# Patient Record
Sex: Male | Born: 1950 | Race: White | Hispanic: No | Marital: Married | Smoking: Former smoker
Health system: Southern US, Community
[De-identification: ages and names within clinical notes are randomized; demographics above are authoritative.]

## PROBLEM LIST (undated history)

## (undated) DIAGNOSIS — I10 Essential (primary) hypertension: Secondary | ICD-10-CM

## (undated) DIAGNOSIS — C4491 Basal cell carcinoma of skin, unspecified: Secondary | ICD-10-CM

## (undated) DIAGNOSIS — E785 Hyperlipidemia, unspecified: Secondary | ICD-10-CM

## (undated) DIAGNOSIS — Z21 Asymptomatic human immunodeficiency virus [HIV] infection status: Secondary | ICD-10-CM

## (undated) DIAGNOSIS — B20 Human immunodeficiency virus [HIV] disease: Secondary | ICD-10-CM

## (undated) DIAGNOSIS — D649 Anemia, unspecified: Secondary | ICD-10-CM

## (undated) HISTORY — DX: Anemia, unspecified: D64.9

## (undated) HISTORY — PX: OTHER SURGICAL HISTORY: SHX169

## (undated) HISTORY — DX: Basal cell carcinoma of skin, unspecified: C44.91

## (undated) HISTORY — DX: Essential (primary) hypertension: I10

## (undated) HISTORY — DX: Hyperlipidemia, unspecified: E78.5

## (undated) HISTORY — PX: COLONOSCOPY: SHX174

---

## 2001-10-29 ENCOUNTER — Encounter: Payer: Self-pay | Admitting: Internal Medicine

## 2003-04-02 ENCOUNTER — Encounter: Payer: Self-pay | Admitting: Internal Medicine

## 2004-12-13 ENCOUNTER — Encounter: Admission: RE | Admit: 2004-12-13 | Discharge: 2004-12-13 | Payer: Self-pay | Admitting: Internal Medicine

## 2004-12-13 ENCOUNTER — Ambulatory Visit: Payer: Self-pay | Admitting: Internal Medicine

## 2004-12-15 ENCOUNTER — Encounter: Admission: RE | Admit: 2004-12-15 | Discharge: 2004-12-15 | Payer: Self-pay | Admitting: Internal Medicine

## 2005-03-24 ENCOUNTER — Ambulatory Visit: Payer: Self-pay | Admitting: Internal Medicine

## 2005-04-11 ENCOUNTER — Ambulatory Visit: Payer: Self-pay | Admitting: Internal Medicine

## 2005-10-03 ENCOUNTER — Ambulatory Visit: Payer: Self-pay | Admitting: Internal Medicine

## 2005-10-10 ENCOUNTER — Ambulatory Visit: Payer: Self-pay | Admitting: Internal Medicine

## 2006-03-26 ENCOUNTER — Ambulatory Visit: Payer: Self-pay | Admitting: Family Medicine

## 2006-03-26 ENCOUNTER — Encounter: Payer: Self-pay | Admitting: Internal Medicine

## 2006-05-15 ENCOUNTER — Ambulatory Visit: Payer: Self-pay | Admitting: Internal Medicine

## 2006-05-15 LAB — CONVERTED CEMR LAB
ALT: 19 units/L (ref 0–40)
AST: 23 units/L (ref 0–37)
Albumin: 4 g/dL (ref 3.5–5.2)
Alkaline Phosphatase: 46 units/L (ref 39–117)
BUN: 15 mg/dL (ref 6–23)
Basophils Relative: 0.3 % (ref 0.0–1.0)
Chol/HDL Ratio, serum: 5.2
Eosinophil percent: 1.4 % (ref 0.0–5.0)
GFR calc non Af Amer: 94 mL/min
Glomerular Filtration Rate, Af Am: 114 mL/min/{1.73_m2}
HDL: 38.2 mg/dL — ABNORMAL LOW (ref >39.0)
LDL Cholesterol: 151 mg/dL — ABNORMAL HIGH (ref 0–99)
MCHC: 33.6 g/dL (ref 30.0–36.0)
MCV: 96.7 fL (ref 78.0–100.0)
Monocytes Absolute: 0.5 10*3/uL (ref 0.2–0.7)
Monocytes Relative: 5.9 % (ref 3.0–11.0)
Neutro Abs: 6.1 10*3/uL (ref 1.4–7.7)
Neutrophils Relative %: 71.5 % (ref 43.0–77.0)
Platelets: 255 10*3/uL (ref 150–400)
RDW: 12.5 % (ref 11.5–14.6)
Sodium: 141 meq/L (ref 135–145)
TSH: 0.54 microintl units/mL (ref 0.35–5.50)
Triglyceride fasting, serum: 52 mg/dL (ref 0–149)
WBC: 8.5 10*3/uL (ref 4.5–10.5)

## 2006-05-25 ENCOUNTER — Ambulatory Visit: Payer: Self-pay | Admitting: Internal Medicine

## 2006-07-06 ENCOUNTER — Ambulatory Visit: Payer: Self-pay | Admitting: Internal Medicine

## 2006-08-06 ENCOUNTER — Ambulatory Visit: Payer: Self-pay | Admitting: Internal Medicine

## 2006-08-06 LAB — CONVERTED CEMR LAB
HDL: 39.7 mg/dL (ref >39.0)
LDL Cholesterol: 141 mg/dL — ABNORMAL HIGH (ref 0–99)

## 2006-08-13 ENCOUNTER — Ambulatory Visit: Payer: Self-pay | Admitting: Internal Medicine

## 2007-03-26 DIAGNOSIS — M810 Age-related osteoporosis without current pathological fracture: Secondary | ICD-10-CM | POA: Insufficient documentation

## 2008-04-22 ENCOUNTER — Ambulatory Visit: Payer: Self-pay | Admitting: Internal Medicine

## 2008-04-22 ENCOUNTER — Encounter: Payer: Self-pay | Admitting: Internal Medicine

## 2008-05-04 ENCOUNTER — Ambulatory Visit: Payer: Self-pay | Admitting: Internal Medicine

## 2008-05-04 LAB — CONVERTED CEMR LAB
BUN: 13 mg/dL (ref 6–23)
Blood in Urine, dipstick: NEGATIVE
CO2: 30 meq/L (ref 19–32)
Chloride: 102 meq/L (ref 96–112)
Creatinine, Ser: 0.8 mg/dL (ref 0.4–1.5)
Eosinophils Relative: 2.5 % (ref 0.0–5.0)
GFR calc Af Amer: 129 mL/min
GFR calc non Af Amer: 107 mL/min
Glucose, Bld: 100 mg/dL — ABNORMAL HIGH (ref 70–99)
Glucose, Urine, Semiquant: NEGATIVE
HCT: 39.8 % (ref 39.0–52.0)
HDL: 44 mg/dL (ref >39.0)
Ketones, urine, test strip: NEGATIVE
MCHC: 34.5 g/dL (ref 30.0–36.0)
MCV: 98.2 fL (ref 78.0–100.0)
Monocytes Absolute: 0.5 10*3/uL (ref 0.1–1.0)
Neutro Abs: 5.4 10*3/uL (ref 1.4–7.7)
Neutrophils Relative %: 67.9 % (ref 43.0–77.0)
PSA: 0.83 ng/mL (ref 0.10–4.00)
Potassium: 4.8 meq/L (ref 3.5–5.1)
Protein, U semiquant: NEGATIVE
RDW: 12.7 % (ref 11.5–14.6)
Total Bilirubin: 1.1 mg/dL (ref 0.3–1.2)
WBC: 7.9 10*3/uL (ref 4.5–10.5)

## 2008-05-11 ENCOUNTER — Ambulatory Visit: Payer: Self-pay | Admitting: Internal Medicine

## 2008-10-14 ENCOUNTER — Encounter: Payer: Self-pay | Admitting: Internal Medicine

## 2009-04-19 ENCOUNTER — Encounter: Payer: Self-pay | Admitting: Internal Medicine

## 2009-07-19 ENCOUNTER — Encounter (INDEPENDENT_AMBULATORY_CARE_PROVIDER_SITE_OTHER): Payer: Self-pay | Admitting: *Deleted

## 2009-08-27 ENCOUNTER — Encounter: Payer: Self-pay | Admitting: Internal Medicine

## 2009-09-06 ENCOUNTER — Encounter: Payer: Self-pay | Admitting: Internal Medicine

## 2009-09-08 ENCOUNTER — Encounter: Payer: Self-pay | Admitting: Internal Medicine

## 2009-10-18 ENCOUNTER — Encounter: Payer: Self-pay | Admitting: Internal Medicine

## 2009-10-19 ENCOUNTER — Encounter: Payer: Self-pay | Admitting: Internal Medicine

## 2009-12-20 ENCOUNTER — Ambulatory Visit: Payer: Self-pay | Admitting: Internal Medicine

## 2009-12-20 LAB — CONVERTED CEMR LAB
ALT: 18 units/L (ref 0–53)
AST: 21 units/L (ref 0–37)
Albumin: 4.3 g/dL (ref 3.5–5.2)
BUN: 16 mg/dL (ref 6–23)
Bilirubin, Direct: 0.1 mg/dL (ref 0.0–0.3)
Blood in Urine, dipstick: NEGATIVE
CO2: 32 meq/L (ref 19–32)
Calcium: 8.9 mg/dL (ref 8.4–10.5)
Eosinophils Absolute: 0.2 10*3/uL (ref 0.0–0.7)
GFR calc non Af Amer: 119.36 mL/min (ref >60)
Glucose, Bld: 100 mg/dL — ABNORMAL HIGH (ref 70–99)
Lymphocytes Relative: 32.3 % (ref 12.0–46.0)
MCHC: 34.3 g/dL (ref 30.0–36.0)
MCV: 98.7 fL (ref 78.0–100.0)
Nitrite: NEGATIVE
Protein, U semiquant: NEGATIVE
RDW: 14.1 % (ref 11.5–14.6)
Specific Gravity, Urine: 1.02
Total Bilirubin: 0.7 mg/dL (ref 0.3–1.2)
Total CHOL/HDL Ratio: 4
Total Protein: 7 g/dL (ref 6.0–8.3)
Triglycerides: 51 mg/dL (ref 0.0–149.0)
Urobilinogen, UA: 0.2
VLDL: 10.2 mg/dL (ref 0.0–40.0)
WBC Urine, dipstick: NEGATIVE

## 2010-01-04 ENCOUNTER — Ambulatory Visit: Payer: Self-pay | Admitting: Internal Medicine

## 2010-01-14 ENCOUNTER — Encounter: Payer: Self-pay | Admitting: Internal Medicine

## 2010-01-17 DIAGNOSIS — F17200 Nicotine dependence, unspecified, uncomplicated: Secondary | ICD-10-CM | POA: Insufficient documentation

## 2010-01-24 ENCOUNTER — Encounter: Payer: Self-pay | Admitting: Internal Medicine

## 2010-03-10 ENCOUNTER — Encounter (INDEPENDENT_AMBULATORY_CARE_PROVIDER_SITE_OTHER): Payer: Self-pay | Admitting: *Deleted

## 2010-08-30 NOTE — Letter (Signed)
Summary: [facility]  [facility]   Imported By: Maryln Gottron 10/22/2009 15:30:54  _____________________________________________________________________  External Attachment:    Type:   Image     Comment:   External Document

## 2010-08-30 NOTE — Letter (Signed)
Summary: [facility]  [facility]   Imported By: Maryln Gottron 01/20/2010 13:28:49  _____________________________________________________________________  External Attachment:    Type:   Image     Comment:   External Document

## 2010-08-30 NOTE — Letter (Signed)
Summary: [facility]  [facility]   Imported By: Maryln Gottron 10/20/2009 13:16:05  _____________________________________________________________________  External Attachment:    Type:   Image     Comment:   External Document

## 2010-08-30 NOTE — Letter (Signed)
Summary: [facility]  [facility]   Imported By: Maryln Gottron 09/13/2009 14:45:21  _____________________________________________________________________  External Attachment:    Type:   Image     Comment:   External Document

## 2010-08-30 NOTE — Assessment & Plan Note (Signed)
Summary: cpx/njr/PT RESCD FROM [facility]//CCM PT RSC/NJR   Vital Signs:  Patient profile:   60 year old male Height:      71 inches Weight:      151 pounds BMI:     21.14 Temp:     98.2 degrees F oral Pulse rate:   76 / minute Resp:     12 per minute BP sitting:   116 / 76  (left arm)  Vitals Entered By: Willy Eddy, LPN (January 04, 2594 3:08 PM) CC: cpx   CC:  cpx.  History of Present Illness: The pt was asked about all immunizations, health maint. services that are appropriate to their age and was given guidance on diet exercize  and weight management moniterif n nof osteoposis medications and diagnosis reviewed the bone density results with by mouth urgered pt to stop smoking  Preventive Screening-Counseling & Management  Alcohol-Tobacco     Smoking Status: current  Problems Prior to Update: 1)  Physical Examination  (ICD-V70.0) 2)  Family History Diabetes 1st Degree Relative  (ICD-V18.0) 3)  Osteoporosis  (ICD-733.00)  Current Problems (verified): 1)  Physical Examination  (ICD-V70.0) 2)  Family History Diabetes 1st Degree Relative  (ICD-V18.0) 3)  Osteoporosis  (ICD-733.00)  Medications Prior to Update: 1)  Micardis 40 Mg Tabs (Telmisartan) .... Once Daily 2)  Boniva 150 Mg Tabs (Ibandronate Sodium) .... One By Mouth Daily 3)  Niaspan 500 Mg  Tbcr (Niacin (Antihyperlipidemic)) .Marland Kitchen.. 1 Once Daily  Current Medications (verified): 1)  Micardis 40 Mg Tabs (Telmisartan) .... Once Daily 2)  Niaspan 500 Mg  Tbcr (Niacin (Antihyperlipidemic)) .Marland Kitchen.. 1 Once Daily  Allergies (verified): No Known Drug Allergies  Past History:  Family History: Last updated: 03/26/2007 Family History Diabetes 1st degree relative Family History Hypertension Family History of Prostate CA 1st degree relative <50 Family History of Cardiovascular disorder  Social History: Last updated: 03/26/2007 Occupation: Single Current Smoker Alcohol use-yes Drug use-no  Risk Factors: Smoking  Status: current (01/04/2010)  Past medical, surgical, family and social histories (including risk factors) reviewed, and no changes noted (except as noted below).  Past Medical History: Reviewed history from 03/26/2007 and no changes required. Osteoporosis  Past Surgical History: Reviewed history from 03/26/2007 and no changes required. Colonoscopy  Family History: Reviewed history from 03/26/2007 and no changes required. Family History Diabetes 1st degree relative Family History Hypertension Family History of Prostate CA 1st degree relative <50 Family History of Cardiovascular disorder  Social History: Reviewed history from 03/26/2007 and no changes required. Occupation: Single Current Smoker Alcohol use-yes Drug use-no  Review of Systems  The patient denies anorexia, fever, weight loss, weight gain, vision loss, decreased hearing, hoarseness, chest pain, syncope, dyspnea on exertion, peripheral edema, prolonged cough, headaches, hemoptysis, abdominal pain, melena, hematochezia, severe indigestion/heartburn, hematuria, incontinence, genital sores, muscle weakness, suspicious skin lesions, transient blindness, difficulty walking, depression, unusual weight change, abnormal bleeding, enlarged lymph nodes, angioedema, breast masses, and testicular masses.    Physical Exam  General:  alert and well-hydrated.   Head:  normocephalic and atraumatic.   Eyes:  pupils equal and pupils round.   Ears:  R ear normal and L ear normal.   Nose:  no external deformity and no nasal discharge.   Mouth:  good dentition and pharynx pink and moist.   Neck:  No deformities, masses, or tenderness noted. Chest Wall:  No deformities, masses, tenderness or gynecomastia noted. Breasts:  no gynecomastia and no masses.   Lungs:  normal respiratory  effort and no intercostal retractions.   Heart:  normal rate and regular rhythm.   Abdomen:  soft, non-tender, normal bowel sounds, and no distention.     Genitalia:  circumcised and no urethral discharge.   Prostate:  no gland enlargement and no asymmetry.   Msk:  No deformity or scoliosis noted of thoracic or lumbar spine.   Pulses:  R and L carotid,radial,femoral,dorsalis pedis and posterior tibial pulses are full and equal bilaterally Extremities:  No clubbing, cyanosis, edema, or deformity noted with normal full range of motion of all joints.   Neurologic:  No cranial nerve deficits noted. Station and gait are normal. Plantar reflexes are down-going bilaterally. DTRs are symmetrical throughout. Sensory, motor and coordinative functions appear intact.   Impression & Recommendations:  Problem # 1:  PHYSICAL EXAMINATION (ICD-V70.0)  Colonoscopy: abnormal (04/10/2005) Td Booster: Historical (07/31/2000)   Flu Vax: Historical (04/30/2009)   Chol: 177 (12/20/2009)   HDL: 49.50 (12/20/2009)   LDL: 117 (12/20/2009)   TG: 51.0 (12/20/2009) TSH: 0.50 (12/20/2009)   PSA: 0.97 (12/20/2009) Next Colonoscopy due:: 03/2010 (01/04/2010)  Discussed using sunscreen, use of alcohol, drug use, self testicular exam, routine dental care, routine eye care, routine physical exam, seat belts, multiple vitamins, osteoporosis prevention, adequate calcium intake in diet, and recommendations for immunizations.  Discussed exercise and checking cholesterol.  Discussed gun safety, safe sex, and contraception. Also recommend checking PSA.  Problem # 2:  OSTEOPOROSIS (ICD-733.00)  The following medications were removed from the medication list:    Boniva 150 Mg Tabs (Ibandronate sodium) ..... One by mouth daily  Discussed medication use, applications of heat or ice, and exercises.   Orders: T-Bone Densitometry [id number])  Problem # 3:  SMOKER (ICD-305.1)  Encouraged smoking cessation and discussed different methods for smoking cessation.   Complete Medication List: 1)  Micardis 40 Mg Tabs (Telmisartan) .... Once daily 2)  Niaspan 500 Mg Tbcr (Niacin  (antihyperlipidemic)) .Marland Kitchen.. 1 once daily   Patient Instructions: 1)  Please schedule a follow-up appointment in 1 year.   Preventive Care Screening  Colonoscopy:    Date:  04/10/2005    Next Due:  03/2010    Results:  abnormal     Immunization History:  Influenza Immunization History:    Influenza:  historical (04/30/2009)    Preventive Care Screening  Colonoscopy:    Date:  04/10/2005    Next Due:  03/2010    Results:  abnormal

## 2010-08-30 NOTE — Letter (Signed)
Summary: [facility]  [facility]   Imported By: Maryln Gottron 09/06/2009 13:44:19  _____________________________________________________________________  External Attachment:    Type:   Image     Comment:   External Document

## 2010-08-30 NOTE — Miscellaneous (Signed)
Summary: BONE DENSITY  Clinical Lists Changes  Orders: Added new Test order of T-Lumbar Vertebral Assessment (77082) - Signed 

## 2010-08-30 NOTE — Letter (Signed)
Summary: Colonoscopy Date Change Letter  [facility]  [address] [place], [place] [address]   Phone: [phone]  Fax: [phone]      March 10, 2010 MRN: [id number]   [facility] [address] [place], [place]  [address]   Dear Mr. Throne,   Previously you were recommended to have a repeat colonoscopy around this time. Your chart was recently reviewed by Dr. Lajuana Ripple T. Russella Dar of  Gastroenterology. Follow up colonoscopy is now recommended in September 2014. This revised recommendation is based on current, nationally recognized guidelines for colorectal cancer screening and polyp surveillance. These guidelines are endorsed by the American Cancer Society, [employer] on Colorectal Cancer as well as numerous other major medical organizations.  Please understand that our recommendation assumes that you do not have any new symptoms such as bleeding, a change in bowel habits, anemia, or significant abdominal discomfort. If you do have any concerning GI symptoms or want to discuss the guideline recommendations, please call to arrange an office visit at your earliest convenience. Otherwise we will keep you in our reminder system and contact you 1-2 months prior to the date listed above to schedule your next colonoscopy.  Thank you,  Malcom T. Russella Dar, M.D.  [facility] [phone]

## 2010-08-30 NOTE — Letter (Signed)
Summary: [facility]  [facility]   Imported By: Maryln Gottron 09/10/2009 12:56:47  _____________________________________________________________________  External Attachment:    Type:   Image     Comment:   External Document

## 2011-06-28 ENCOUNTER — Other Ambulatory Visit: Payer: Self-pay | Admitting: Dermatology

## 2011-10-11 ENCOUNTER — Other Ambulatory Visit (INDEPENDENT_AMBULATORY_CARE_PROVIDER_SITE_OTHER): Payer: BC Managed Care – PPO

## 2011-10-11 DIAGNOSIS — Z0000 Encounter for general adult medical examination without abnormal findings: Secondary | ICD-10-CM

## 2011-10-11 LAB — LIPID PANEL
HDL: 54.3 mg/dL (ref >39.00)
LDL Cholesterol: 124 mg/dL — ABNORMAL HIGH (ref 0–99)
Triglycerides: 37 mg/dL (ref 0.0–149.0)
VLDL: 7.4 mg/dL (ref 0.0–40.0)

## 2011-10-11 LAB — BASIC METABOLIC PANEL
CO2: 30 mEq/L (ref 19–32)
Chloride: 104 mEq/L (ref 96–112)
Creatinine, Ser: 0.8 mg/dL (ref 0.4–1.5)
Glucose, Bld: 106 mg/dL — ABNORMAL HIGH (ref 70–99)
Sodium: 141 mEq/L (ref 135–145)

## 2011-10-11 LAB — CBC WITH DIFFERENTIAL/PLATELET
Basophils Absolute: 0 10*3/uL (ref 0.0–0.1)
Eosinophils Relative: 4 % (ref 0.0–5.0)
HCT: 41.6 % (ref 39.0–52.0)
Lymphocytes Relative: 24.3 % (ref 12.0–46.0)
Lymphs Abs: 1.6 10*3/uL (ref 0.7–4.0)
MCHC: 33.4 g/dL (ref 30.0–36.0)
Monocytes Absolute: 0.5 10*3/uL (ref 0.1–1.0)
Monocytes Relative: 8.1 % (ref 3.0–12.0)
Neutro Abs: 4.2 10*3/uL (ref 1.4–7.7)
RDW: 13.7 % (ref 11.5–14.6)

## 2011-10-11 LAB — POCT URINALYSIS DIPSTICK
Bilirubin, UA: NEGATIVE
Glucose, UA: NEGATIVE
Ketones, UA: NEGATIVE
Leukocytes, UA: NEGATIVE
Nitrite, UA: NEGATIVE

## 2011-10-11 LAB — PSA: PSA: 0.59 ng/mL (ref 0.10–4.00)

## 2011-10-11 LAB — HEPATIC FUNCTION PANEL
ALT: 18 U/L (ref 0–53)
AST: 22 U/L (ref 0–37)
Albumin: 4.2 g/dL (ref 3.5–5.2)
Bilirubin, Direct: 0.2 mg/dL (ref 0.0–0.3)
Total Bilirubin: 0.6 mg/dL (ref 0.3–1.2)

## 2011-10-16 ENCOUNTER — Telehealth: Payer: Self-pay | Admitting: *Deleted

## 2011-10-16 MED ORDER — OSELTAMIVIR PHOSPHATE 75 MG PO CAPS: 75.0000 mg | ORAL_CAPSULE | Freq: Two times a day (BID) | ORAL | Status: DC

## 2011-10-16 NOTE — Telephone Encounter (Signed)
Pt has CPX on Wed with Dr. Lovell Sheehan, but thinks he has the flu.  C/O myalgias, headache, fever 102, minimal cough.  Has been taking Tylenol extra strength, but is still uncomfortable.  Asking what Dr. Lovell Sheehan suggests?

## 2011-10-16 NOTE — Telephone Encounter (Signed)
Discussed with pt

## 2011-10-16 NOTE — Telephone Encounter (Signed)
Per dr Esmond Camper- if sx have been less that 24 hours ,may have tamiflu 75 bid for 5 days and if it has been greater than 24 hours will need to use mucinex fast max cold and flu-he may  Need to cancel cpx

## 2011-10-17 ENCOUNTER — Encounter: Payer: Self-pay | Admitting: *Deleted

## 2011-10-17 ENCOUNTER — Encounter: Payer: Self-pay | Admitting: Internal Medicine

## 2011-10-18 ENCOUNTER — Encounter: Payer: Self-pay | Admitting: Internal Medicine

## 2011-10-18 ENCOUNTER — Ambulatory Visit (INDEPENDENT_AMBULATORY_CARE_PROVIDER_SITE_OTHER): Payer: BC Managed Care – PPO | Admitting: Internal Medicine

## 2011-10-18 VITALS — BP 160/90 | HR 80 | Temp 102.9°F | Resp 16 | Ht 70.0 in | Wt 154.0 lb

## 2011-10-18 DIAGNOSIS — J1100 Influenza due to unidentified influenza virus with unspecified type of pneumonia: Secondary | ICD-10-CM

## 2011-10-18 DIAGNOSIS — Z0000 Encounter for general adult medical examination without abnormal findings: Secondary | ICD-10-CM

## 2011-10-18 MED ORDER — PSEUDOEPH-CHLORPHEN-HYDROCOD 60-4-5 MG/5ML PO SOLN: 5.0000 mL | Freq: Three times a day (TID) | ORAL | Status: DC

## 2011-10-18 MED ORDER — MOXIFLOXACIN HCL 400 MG PO TABS: 400.0000 mg | ORAL_TABLET | Freq: Every day | ORAL | Status: AC

## 2011-10-18 MED ORDER — MOXIFLOXACIN HCL 400 MG PO TABS: 400.0000 mg | ORAL_TABLET | Freq: Every day | ORAL | Status: DC

## 2011-10-18 NOTE — Progress Notes (Signed)
  Subjective:    Patient ID: Jeffery Franklin, male    DOB: 1951/01/24, 61 y.o.   MRN: [id number]  HPI This is a 61 year old white male who presents for his routine yearly physical examination.  He is significantly sick today with a fever of 102.9 and flulike symptoms for the past 3 days he began Tamiflu but I believe this intervention was too late he has been using Tylenol for his fevers which apparently only reduced the fever slightly.  He is in no respiratory distress he states he has myalgias headaches or throat all classic symptoms of the flu.  He did not get a vaccination this year   Review of Systems  Constitutional: Negative for fever and fatigue.  HENT: Negative for hearing loss, congestion, neck pain and postnasal drip.   Eyes: Negative for discharge, redness and visual disturbance.  Respiratory: Negative for cough, shortness of breath and wheezing.   Cardiovascular: Negative for leg swelling.  Gastrointestinal: Negative for abdominal pain, constipation and abdominal distention.  Genitourinary: Negative for urgency and frequency.  Musculoskeletal: Negative for joint swelling and arthralgias.  Skin: Negative for color change and rash.  Neurological: Negative for weakness and light-headedness.  Hematological: Negative for adenopathy.  Psychiatric/Behavioral: Negative for behavioral problems.       Objective:   Physical Exam  Nursing note and vitals reviewed. Constitutional: He is oriented to person, place, and time. He appears well-developed and well-nourished.  HENT:  Head: Normocephalic and atraumatic.  Eyes: Conjunctivae are normal. Pupils are equal, round, and reactive to light.  Neck: Normal range of motion. Neck supple.  Cardiovascular: Normal rate and regular rhythm.   Pulmonary/Chest: Effort normal and breath sounds normal.  Abdominal: Soft. Bowel sounds are normal.  Genitourinary: Rectum normal and prostate normal.  Musculoskeletal: Normal range of motion.    Neurological: He is alert and oriented to person, place, and time.  Skin: Skin is warm and dry.          Assessment & Plan:   Patient presents for yearly preventative medicine examination.   all immunizations and health maintenance protocols were reviewed with the patient and they are up to date with these protocols.   screening laboratory values were reviewed with the patient including screening of hyperlipidemia PSA renal function and hepatic function.   There medications past medical history social history problem list and allergies were reviewed in detail.   Goals were established with regard to weight loss exercise diet in compliance with medications   patient's classic symptoms of influenza he would be treated with Advil hydration and rest I believe that the Tamiflu would be of little effect at this stage.  He does have a history of smoking and he has some left lower lung field sounds that could be an early pneumonia complicating his flu. He has begun coughing up colored sputum which previously he had not therefore we will treat him with a seven-day course of Avelox and cough suppressant

## 2011-10-18 NOTE — Patient Instructions (Signed)
The patient is instructed to continue all medications as prescribed. Schedule followup with check out clerk upon leaving the clinic  

## 2011-10-25 ENCOUNTER — Ambulatory Visit: Payer: BC Managed Care – PPO | Admitting: Family Medicine

## 2011-11-01 ENCOUNTER — Ambulatory Visit (INDEPENDENT_AMBULATORY_CARE_PROVIDER_SITE_OTHER): Payer: BC Managed Care – PPO | Admitting: Internal Medicine

## 2011-11-01 DIAGNOSIS — Z23 Encounter for immunization: Secondary | ICD-10-CM

## 2011-11-01 DIAGNOSIS — Z0000 Encounter for general adult medical examination without abnormal findings: Secondary | ICD-10-CM

## 2011-11-10 ENCOUNTER — Ambulatory Visit (INDEPENDENT_AMBULATORY_CARE_PROVIDER_SITE_OTHER): Payer: BC Managed Care – PPO | Admitting: Family

## 2011-11-10 ENCOUNTER — Encounter: Payer: Self-pay | Admitting: Family

## 2011-11-10 ENCOUNTER — Telehealth: Payer: Self-pay | Admitting: *Deleted

## 2011-11-10 VITALS — BP 120/80 | Temp 99.2°F | Wt 150.0 lb

## 2011-11-10 DIAGNOSIS — R05 Cough: Secondary | ICD-10-CM

## 2011-11-10 DIAGNOSIS — J329 Chronic sinusitis, unspecified: Secondary | ICD-10-CM

## 2011-11-10 MED ORDER — METHYLPREDNISOLONE ACETATE 80 MG/ML IJ SUSP
80.0000 mg | Freq: Once | INTRAMUSCULAR | Status: AC
  Administered 2011-11-10: 80 mg via INTRAMUSCULAR

## 2011-11-10 MED ORDER — AZITHROMYCIN 250 MG PO TABS: ORAL_TABLET | ORAL | Status: AC

## 2011-11-10 MED ORDER — METHYLPREDNISOLONE ACETATE 80 MG/ML IJ SUSP: 80.0000 mg | Freq: Once | INTRAMUSCULAR | Status: DC

## 2011-11-10 NOTE — Telephone Encounter (Signed)
Pt called and said that he will come in for ov instead. Pt has been schd to see Adline Mango today at 2:30pm.

## 2011-11-10 NOTE — Telephone Encounter (Signed)
Pt is still having swollen and painful lymph nodes behind left ear with headaches lingering from the flu.  He still has very little appetite, and no energy.  Asking if he needs an appt?

## 2011-11-10 NOTE — Patient Instructions (Signed)

## 2011-11-10 NOTE — Progress Notes (Signed)
  Subjective:    Patient ID: Jeffery Franklin, male    DOB: Oct 23, 1950, 61 y.o.   MRN: [id number]  HPI Comments: 71 you white male c/o productive cough with brown tinged sputum, loss of appetite, and sinus drainage x two weeks. "I had the flu a couple weeks ago and was treated with tamiflu, a week after the flu these s/s started." Smokes 5-10 cigarettes per day and is currently seeing a hypnotist to assist with smoking cessation. Denies fever, chills, nausea, vomiting, or shortness of breath.     Review of Systems  Constitutional: Positive for appetite change. Negative for fever, chills, diaphoresis, fatigue and unexpected weight change.  HENT: Positive for congestion, postnasal drip and sinus pressure. Negative for hearing loss, ear pain, nosebleeds, sore throat, facial swelling, rhinorrhea, sneezing, drooling, mouth sores, trouble swallowing, neck pain, neck stiffness, dental problem, voice change, tinnitus and ear discharge.   Respiratory: Negative.   Cardiovascular: Negative.   Skin: Negative.    Past Medical History  Diagnosis Date  . Osteoporosis   . Hypertension   . Hyperlipidemia     History   Social History  . Marital Status: Single    Spouse Name: N/A    Number of Children: N/A  . Years of Education: N/A   Occupational History  . Not on file.   Social History Main Topics  . Smoking status: Current Everyday Smoker  . Smokeless tobacco: Not on file  . Alcohol Use: Yes  . Drug Use: No  . Sexually Active: Not on file   Other Topics Concern  . Not on file   Social History Narrative  . No narrative on file    Past Surgical History  Procedure Date  . Colonoscopy     Family History  Problem Relation Age of Onset  . Diabetes    . Hypertension    . Prostate cancer    . Heart disease      Not on File  Current Outpatient Prescriptions on File Prior to Visit  Medication Sig Dispense Refill  . niacin (NIASPAN) 500 MG CR tablet Take 500 mg by mouth at bedtime.       . Pseudoeph-Chlorphen-Hydrocod 60-4-5 MG/5ML SOLN Take 5 mLs by mouth 3 (three) times daily.      Marland Kitchen telmisartan (MICARDIS) 40 MG tablet Take 40 mg by mouth daily.        BP 120/80  Temp(Src) 99.2 F (37.3 C) (Oral)  Wt 150 lb (68.04 kg)chart    Objective:   Physical Exam  Constitutional: He is oriented to person, place, and time. He appears well-developed and well-nourished. No distress.  HENT:  Head: Normocephalic.  Right Ear: External ear normal.  Left Ear: External ear normal.  Mouth/Throat: Oropharyngeal exudate present.  Eyes: Right eye exhibits no discharge. Left eye exhibits no discharge.  Cardiovascular: Normal rate, regular rhythm, normal heart sounds and intact distal pulses.  Exam reveals no gallop and no friction rub.   No murmur heard. Pulmonary/Chest: Effort normal. No respiratory distress. He has no wheezes. He has no rales. He exhibits no tenderness.  Neurological: He is alert and oriented to person, place, and time.  Skin: Skin is warm and dry. He is not diaphoretic.          Assessment & Plan:  Assessment: Sinusitis Plan: Depo-medrol im, z-pack, increase po fluids and rest, teaching handouts of diagnosis and treatment provided. Opportunity for questions provided. Encouraged to RTC if s/s get worse.

## 2011-11-28 ENCOUNTER — Other Ambulatory Visit: Payer: Self-pay | Admitting: *Deleted

## 2011-11-28 MED ORDER — TELMISARTAN 40 MG PO TABS: 40.0000 mg | ORAL_TABLET | Freq: Every day | ORAL | Status: DC

## 2011-11-28 NOTE — Telephone Encounter (Signed)
Done

## 2012-01-09 ENCOUNTER — Ambulatory Visit (INDEPENDENT_AMBULATORY_CARE_PROVIDER_SITE_OTHER): Payer: BC Managed Care – PPO | Admitting: Family

## 2012-01-09 ENCOUNTER — Encounter: Payer: Self-pay | Admitting: Family

## 2012-01-09 ENCOUNTER — Ambulatory Visit (INDEPENDENT_AMBULATORY_CARE_PROVIDER_SITE_OTHER)
Admission: RE | Admit: 2012-01-09 | Discharge: 2012-01-09 | Disposition: A | Discharge status: Home / Self Care | Payer: BC Managed Care – PPO | Source: Ambulatory Visit | Attending: Family | Admitting: Family

## 2012-01-09 VITALS — BP 160/88 | Temp 98.6°F | Wt 148.0 lb

## 2012-01-09 DIAGNOSIS — M7989 Other specified soft tissue disorders: Secondary | ICD-10-CM

## 2012-01-09 DIAGNOSIS — S6980XA Other specified injuries of unspecified wrist, hand and finger(s), initial encounter: Secondary | ICD-10-CM

## 2012-01-09 DIAGNOSIS — S6990XA Unspecified injury of unspecified wrist, hand and finger(s), initial encounter: Secondary | ICD-10-CM

## 2012-01-09 MED ORDER — IBUPROFEN 600 MG PO TABS: 600.0000 mg | ORAL_TABLET | Freq: Three times a day (TID) | ORAL | Status: AC | PRN

## 2012-01-09 NOTE — Patient Instructions (Signed)
Finger Fracture Fractures of fingers are breaks in the bones of the fingers. There are many types of fractures. There are different ways of treating these fractures, all of which can be correct. Your caregiver will discuss the best way to treat your fracture. TREATMENT  Finger fractures can be treated with:   Non-reduction - this means the bones are in place. The finger is splinted without changing the positions of the bone pieces. The splint is usually left on for about a week to ten days. This will depend on your fracture and what your caregiver thinks.   Closed reduction - the bones are put back into position without using surgery. The finger is then splinted.   ORIF (open reduction and internal fixation) - the fracture site is opened. Then the bone pieces are fixed into place with pins or some type of hardware. This is seldom required. It depends on the severity of the fracture.  Your caregiver will discuss the type of fracture you have and the treatment that will be best for that problem. If surgery is the treatment of choice, the following is information for you to know and also let your caregiver know about prior to surgery. LET YOUR CAREGIVER KNOW ABOUT:  Allergies   Medications taken including herbs, eye drops, over the counter medications, and creams   Use of steroids (by mouth or creams)   Previous problems with anesthetics or Novocaine   Possibility of pregnancy, if this applies   History of blood clots (thrombophlebitis)   History of bleeding or blood problems   Previous surgery   Other health problems  AFTER THE PROCEDURE After surgery, you will be taken to the recovery area where a nurse will check your progress. Once you're awake, stable, and taking fluids well, barring other problems you will be allowed to go home. Once home an ice pack applied to your operative site may help with discomfort and keep the swelling down. HOME CARE INSTRUCTIONS   Follow your  caregiver's instructions as to activities, exercises, physical therapy, and driving a car.   Use your finger and exercise as directed.   Only take over-the-counter or prescription medicines for pain, discomfort, or fever as directed by your caregiver. Do not take aspirin until your caregiver OK's it, as this can increase bleeding immediately following surgery.   Stop using ibuprofen if it upsets your stomach. Let your caregiver know about it.  SEEK MEDICAL CARE IF:  You have increased bleeding (more than a small spot) from the wound or from beneath your splint.   You develop redness, swelling, or increasing pain in the wound or from beneath your splint.   There is pus coming from the wound or from beneath your splint.   An unexplained oral temperature above 102 F (38.9 C) develops, or as your caregiver suggests.   There is a foul smell coming from the wound or dressing or from beneath your splint.  SEEK IMMEDIATE MEDICAL CARE IF:   You develop a rash.   You have difficulty breathing.   You have any allergic problems.  MAKE SURE YOU:   Understand these instructions.   Will watch your condition.   Will get help right away if you are not doing well or get worse.  Document Released: 10/29/2000 Document Revised: 07/06/2011 Document Reviewed: 03/05/2008 Exit[employer] Patient Information 2012 [employer]. 

## 2012-01-09 NOTE — Progress Notes (Signed)
Subjective:    Patient ID: Jeffery Franklin, male    DOB: Mar 11, 1951, 61 y.o.   MRN: [id number]  HPI  61 year old Caucasian male, smoker, presents with swelling to the right middle finger for the past week. Remembers bumping his finger against something a week ago. States he had pain initially, but it has since resolved. Swelling has remained and he notes it is worse after periods of increased use of that finger. Says it feels "tight" with movement. Denies decreased strength or sensation to the middle finger.     Review of Systems  Constitutional: Negative.   Musculoskeletal: Positive for joint swelling (to right middle finger).       No previous injury to this finger.  Neurological: Negative for weakness and numbness.   Past Medical History  Diagnosis Date  . Osteoporosis   . Hypertension   . Hyperlipidemia     History   Social History  . Marital Status: Single    Spouse Name: N/A    Number of Children: N/A  . Years of Education: N/A   Occupational History  . Not on file.   Social History Main Topics  . Smoking status: Current Everyday Smoker  . Smokeless tobacco: Not on file  . Alcohol Use: Yes  . Drug Use: No  . Sexually Active: Not on file   Other Topics Concern  . Not on file   Social History Narrative  . No narrative on file    Past Surgical History  Procedure Date  . Colonoscopy     Family History  Problem Relation Age of Onset  . Diabetes    . Hypertension    . Prostate cancer    . Heart disease      No Known Allergies  Current Outpatient Prescriptions on File Prior to Visit  Medication Sig Dispense Refill  . niacin (NIASPAN) 500 MG CR tablet Take 500 mg by mouth at bedtime.      Marland Kitchen telmisartan (MICARDIS) 40 MG tablet Take 1 tablet (40 mg total) by mouth daily.  90 tablet  3  . methylPREDNISolone acetate (DEPO-MEDROL) 80 MG/ML injection Inject 1 mL (80 mg total) into the muscle once.  5 mL  0  . Pseudoeph-Chlorphen-Hydrocod 60-4-5 MG/5ML SOLN  Take 5 mLs by mouth 3 (three) times daily.        BP 160/88  Temp(Src) 98.6 F (37 C) (Oral)  Wt 148 lb (67.132 kg)chart     Objective:   Physical Exam  Constitutional: He is oriented to person, place, and time. He appears well-developed and well-nourished.  Cardiovascular: Normal rate, regular rhythm and normal heart sounds.  Exam reveals no gallop and no friction rub.   No murmur heard. Pulmonary/Chest: Effort normal and breath sounds normal. No respiratory distress. He has no wheezes. He has no rales.  Musculoskeletal:       Right hand: He exhibits decreased range of motion (decreased flexion of right middle finger), bony tenderness (to middle finger, middle phalanx) and swelling. He exhibits normal capillary refill, no deformity and no laceration. normal sensation noted. Normal strength noted.  Neurological: He is alert and oriented to person, place, and time.  Skin: Skin is warm and dry. No erythema.       No contusion noted.           Assessment & Plan:  Assessment: Finger injury, swelling of finger  Plan: Obtain xray of right middle finger for possible fracture to the finger. Start ibuprofen 600 mg  every 8 hours for swelling. Will contact with results of xray when available. Follow up as needed.

## 2012-04-09 ENCOUNTER — Ambulatory Visit (INDEPENDENT_AMBULATORY_CARE_PROVIDER_SITE_OTHER): Payer: BC Managed Care – PPO | Admitting: Family

## 2012-04-09 ENCOUNTER — Encounter: Payer: Self-pay | Admitting: Family

## 2012-04-09 VITALS — BP 120/80 | HR 70 | Wt 148.0 lb

## 2012-04-09 DIAGNOSIS — H9209 Otalgia, unspecified ear: Secondary | ICD-10-CM

## 2012-04-09 DIAGNOSIS — H60399 Other infective otitis externa, unspecified ear: Secondary | ICD-10-CM

## 2012-04-09 DIAGNOSIS — H6090 Unspecified otitis externa, unspecified ear: Secondary | ICD-10-CM

## 2012-04-09 MED ORDER — NEOMYCIN-POLYMYXIN-HC 3.5-10000-1 OT SOLN: 3.0000 [drp] | Freq: Four times a day (QID) | OTIC | Status: AC

## 2012-04-09 NOTE — Progress Notes (Signed)
  Subjective:    Patient ID: Jeffery Franklin, male    DOB: [birth year], 61 y.o.   MRN: [id number]  HPI 61 year old white male, nonsmoker, patient of Dr. Lovell Sheehan is in today with complaints of right ear pain times one day. He is to take an ibuprofen with no relief. Denies any drainage and discharge. He has been putting peroxide and this year with no relief.   Review of Systems  Constitutional: Negative.   HENT: Positive for ear pain. Negative for congestion, sore throat, rhinorrhea and trouble swallowing.   Respiratory: Negative.   Cardiovascular: Negative.   Skin: Negative.   Neurological: Negative.   Psychiatric/Behavioral: Negative.    Past Medical History  Diagnosis Date  . Osteoporosis   . Hypertension   . Hyperlipidemia     History   Social History  . Marital Status: Single    Spouse Name: N/A    Number of Children: N/A  . Years of Education: N/A   Occupational History  . Not on file.   Social History Main Topics  . Smoking status: Current Everyday Smoker  . Smokeless tobacco: Not on file  . Alcohol Use: Yes  . Drug Use: No  . Sexually Active: Not on file   Other Topics Concern  . Not on file   Social History Narrative  . No narrative on file    Past Surgical History  Procedure Date  . Colonoscopy     Family History  Problem Relation Age of Onset  . Diabetes    . Hypertension    . Prostate cancer    . Heart disease      No Known Allergies  Current Outpatient Prescriptions on File Prior to Visit  Medication Sig Dispense Refill  . methylPREDNISolone acetate (DEPO-MEDROL) 80 MG/ML injection Inject 1 mL (80 mg total) into the muscle once.  5 mL  0  . niacin (NIASPAN) 500 MG CR tablet Take 500 mg by mouth at bedtime.      . Pseudoeph-Chlorphen-Hydrocod 60-4-5 MG/5ML SOLN Take 5 mLs by mouth 3 (three) times daily.      Marland Kitchen telmisartan (MICARDIS) 40 MG tablet Take 1 tablet (40 mg total) by mouth daily.  90 tablet  3    BP 120/80  Pulse 70  Wt 148 lb  (67.132 kg)chart    Objective:   Physical Exam  Constitutional: He is oriented to person, place, and time. He appears well-developed and well-nourished.  HENT:  Left Ear: External ear normal.  Mouth/Throat: Oropharynx is clear and moist.       Tragal tenderness to palpation. Swollen tympanic membrane  Neck: Normal range of motion. Neck supple.  Cardiovascular: Normal rate, regular rhythm and normal heart sounds.   Pulmonary/Chest: Breath sounds normal.  Abdominal: Soft. Bowel sounds are normal.  Neurological: He is alert and oriented to person, place, and time.  Skin: Skin is warm and dry.  Psychiatric: He has a normal mood and affect.          Assessment & Plan:  Assessment: Otitis Externa, Otalgia  Plan: Cortisporin otic 3 drops in the right ear every 6 hours x5 days. Ibuprofen for pain relief. Patient call the office if symptoms worsen or persist. Recheck a schedule, and when necessary.

## 2012-10-11 ENCOUNTER — Other Ambulatory Visit (INDEPENDENT_AMBULATORY_CARE_PROVIDER_SITE_OTHER): Payer: BC Managed Care – PPO

## 2012-10-11 DIAGNOSIS — Z0000 Encounter for general adult medical examination without abnormal findings: Secondary | ICD-10-CM

## 2012-10-11 LAB — CBC WITH DIFFERENTIAL/PLATELET
Basophils Absolute: 0 10*3/uL (ref 0.0–0.1)
Basophils Relative: 0.3 % (ref 0.0–3.0)
Eosinophils Absolute: 0.1 10*3/uL (ref 0.0–0.7)
HCT: 40.5 % (ref 39.0–52.0)
Lymphocytes Relative: 11.9 % — ABNORMAL LOW (ref 12.0–46.0)
Lymphs Abs: 0.6 10*3/uL — ABNORMAL LOW (ref 0.7–4.0)
Monocytes Relative: 9 % (ref 3.0–12.0)
Neutro Abs: 4 10*3/uL (ref 1.4–7.7)
Neutrophils Relative %: 77.4 % — ABNORMAL HIGH (ref 43.0–77.0)
RBC: 4.4 Mil/uL (ref 4.22–5.81)

## 2012-10-11 LAB — HEPATIC FUNCTION PANEL
Albumin: 4 g/dL (ref 3.5–5.2)
Bilirubin, Direct: 0.1 mg/dL (ref 0.0–0.3)
Total Protein: 7.2 g/dL (ref 6.0–8.3)

## 2012-10-11 LAB — POCT URINALYSIS DIPSTICK
Bilirubin, UA: NEGATIVE
Blood, UA: NEGATIVE
Glucose, UA: NEGATIVE
Ketones, UA: NEGATIVE
Spec Grav, UA: 1.02
pH, UA: 7

## 2012-10-11 LAB — BASIC METABOLIC PANEL
BUN: 18 mg/dL (ref 6–23)
GFR: 97.61 mL/min (ref >60.00)
Glucose, Bld: 99 mg/dL (ref 70–99)
Potassium: 4.6 mEq/L (ref 3.5–5.1)
Sodium: 137 mEq/L (ref 135–145)

## 2012-10-11 LAB — LIPID PANEL
Cholesterol: 166 mg/dL (ref 0–200)
LDL Cholesterol: 119 mg/dL — ABNORMAL HIGH (ref 0–99)
Total CHOL/HDL Ratio: 5

## 2012-10-18 ENCOUNTER — Ambulatory Visit (INDEPENDENT_AMBULATORY_CARE_PROVIDER_SITE_OTHER): Payer: BC Managed Care – PPO | Admitting: Internal Medicine

## 2012-10-18 ENCOUNTER — Encounter: Payer: Self-pay | Admitting: Internal Medicine

## 2012-10-18 VITALS — BP 136/82 | HR 64 | Temp 97.9°F | Resp 16 | Ht 70.0 in | Wt 149.0 lb

## 2012-10-18 DIAGNOSIS — I10 Essential (primary) hypertension: Secondary | ICD-10-CM

## 2012-10-18 DIAGNOSIS — Z0000 Encounter for general adult medical examination without abnormal findings: Secondary | ICD-10-CM

## 2012-10-18 NOTE — Progress Notes (Signed)
  Subjective:    Patient ID: Jeffery Franklin, male    DOB: 07-28-1951, 62 y.o.   MRN: [id number]  HPI  CPX still smoking but less  Review of Systems  Constitutional: Negative for fever and fatigue.  HENT: Negative for hearing loss, congestion, neck pain and postnasal drip.   Eyes: Negative for discharge, redness and visual disturbance.  Respiratory: Negative for cough, shortness of breath and wheezing.   Cardiovascular: Negative for leg swelling.  Gastrointestinal: Negative for abdominal pain, constipation and abdominal distention.  Genitourinary: Negative for urgency and frequency.  Musculoskeletal: Negative for joint swelling and arthralgias.  Skin: Negative for color change and rash.  Neurological: Negative for weakness and light-headedness.  Hematological: Negative for adenopathy.  Psychiatric/Behavioral: Negative for behavioral problems.       Objective:   Physical Exam  Nursing note and vitals reviewed. Constitutional: He appears well-developed and well-nourished.  HENT:  Head: Normocephalic and atraumatic.  Eyes: Conjunctivae are normal. Pupils are equal, round, and reactive to light.  Neck: Normal range of motion. Neck supple.  Cardiovascular: Normal rate and regular rhythm.   Pulmonary/Chest: Effort normal and breath sounds normal.  Abdominal: Soft. Bowel sounds are normal.  Genitourinary: Prostate normal.  Musculoskeletal: Normal range of motion.  Neurological: He is alert.  Skin: Skin is warm and dry.  Psychiatric: He has a normal mood and affect. His behavior is normal.          Assessment & Plan:   Patient presents for yearly preventative medicine examination.   all immunizations and health maintenance protocols were reviewed with the patient and they are up to date with these protocols.   screening laboratory values were reviewed with the patient including screening of hyperlipidemia PSA renal function and hepatic function.   There medications past  medical history social history problem list and allergies were reviewed in detail.   Goals were established with regard to weight loss exercise diet in compliance with medications

## 2012-10-19 ENCOUNTER — Encounter: Payer: Self-pay | Admitting: Internal Medicine

## 2012-10-21 ENCOUNTER — Other Ambulatory Visit: Payer: Self-pay | Admitting: *Deleted

## 2012-10-21 DIAGNOSIS — Z87891 Personal history of nicotine dependence: Secondary | ICD-10-CM

## 2012-10-21 LAB — VARICELLA ZOSTER ANTIBODY, IGG: Varicella IgG: 107.7 Index (ref <135.00)

## 2012-10-25 ENCOUNTER — Encounter: Payer: Self-pay | Admitting: Internal Medicine

## 2013-02-26 ENCOUNTER — Encounter: Payer: Self-pay | Admitting: Gastroenterology

## 2013-03-05 ENCOUNTER — Telehealth: Payer: Self-pay | Admitting: Internal Medicine

## 2013-03-05 ENCOUNTER — Encounter: Payer: Self-pay | Admitting: Internal Medicine

## 2013-03-05 MED ORDER — TELMISARTAN 40 MG PO TABS: 40.0000 mg | ORAL_TABLET | Freq: Every day | ORAL | Status: DC

## 2013-03-05 NOTE — Telephone Encounter (Signed)
Jeffery Franklin needs refills on his telmisartan (MICARDIS) 40 MG tablet. He has new insurance. Please send to [facility] mail order. He no longer uses [facility].

## 2013-04-18 ENCOUNTER — Encounter: Payer: Self-pay | Admitting: Gastroenterology

## 2013-06-05 ENCOUNTER — Other Ambulatory Visit: Payer: Self-pay

## 2013-06-11 ENCOUNTER — Other Ambulatory Visit: Payer: Self-pay | Admitting: Dermatology

## 2013-06-16 ENCOUNTER — Ambulatory Visit (AMBULATORY_SURGERY_CENTER): Payer: Self-pay

## 2013-06-16 VITALS — Ht 70.0 in | Wt 143.0 lb

## 2013-06-16 DIAGNOSIS — Z860100 Personal history of colon polyps, unspecified: Secondary | ICD-10-CM

## 2013-06-16 MED ORDER — MOVIPREP 100 G PO SOLR: ORAL | Status: DC

## 2013-06-18 ENCOUNTER — Encounter: Payer: Self-pay | Admitting: Gastroenterology

## 2013-06-18 ENCOUNTER — Encounter: Payer: Self-pay | Admitting: Family Medicine

## 2013-06-18 ENCOUNTER — Ambulatory Visit (INDEPENDENT_AMBULATORY_CARE_PROVIDER_SITE_OTHER): Payer: 59 | Admitting: Family Medicine

## 2013-06-18 VITALS — BP 148/90 | HR 68 | Ht 70.0 in | Wt 141.0 lb

## 2013-06-18 DIAGNOSIS — M810 Age-related osteoporosis without current pathological fracture: Secondary | ICD-10-CM

## 2013-06-18 DIAGNOSIS — R5383 Other fatigue: Secondary | ICD-10-CM

## 2013-06-18 DIAGNOSIS — Z23 Encounter for immunization: Secondary | ICD-10-CM

## 2013-06-18 DIAGNOSIS — Z2911 Encounter for prophylactic immunotherapy for respiratory syncytial virus (RSV): Secondary | ICD-10-CM

## 2013-06-18 DIAGNOSIS — R634 Abnormal weight loss: Secondary | ICD-10-CM

## 2013-06-18 DIAGNOSIS — R5381 Other malaise: Secondary | ICD-10-CM

## 2013-06-18 DIAGNOSIS — F17200 Nicotine dependence, unspecified, uncomplicated: Secondary | ICD-10-CM

## 2013-06-18 LAB — COMPREHENSIVE METABOLIC PANEL
ALT: 35 U/L (ref 0–53)
Albumin: 3.9 g/dL (ref 3.5–5.2)
Alkaline Phosphatase: 72 U/L (ref 39–117)
BUN: 9 mg/dL (ref 6–23)
CO2: 29 mEq/L (ref 19–32)
Calcium: 8.9 mg/dL (ref 8.4–10.5)
Chloride: 99 mEq/L (ref 96–112)
Creat: 0.75 mg/dL (ref 0.50–1.35)
Glucose, Bld: 112 mg/dL — ABNORMAL HIGH (ref 70–99)
Potassium: 4.2 mEq/L (ref 3.5–5.3)
Sodium: 143 mEq/L (ref 135–145)
Total Protein: 7.7 g/dL (ref 6.0–8.3)

## 2013-06-18 LAB — CBC WITH DIFFERENTIAL/PLATELET
Basophils Absolute: 0 10*3/uL (ref 0.0–0.1)
Basophils Relative: 0 % (ref 0–1)
HCT: 35.8 % — ABNORMAL LOW (ref 39.0–52.0)
Hemoglobin: 12.5 g/dL — ABNORMAL LOW (ref 13.0–17.0)
Lymphocytes Relative: 22 % (ref 12–46)
Lymphs Abs: 0.9 10*3/uL (ref 0.7–4.0)
MCV: 89.1 fL (ref 78.0–100.0)
Monocytes Absolute: 0.5 10*3/uL (ref 0.1–1.0)
Monocytes Relative: 13 % — ABNORMAL HIGH (ref 3–12)
Neutro Abs: 2.4 10*3/uL (ref 1.7–7.7)
Neutrophils Relative %: 63 % (ref 43–77)
RBC: 4.02 MIL/uL — ABNORMAL LOW (ref 4.22–5.81)
RDW: 14.1 % (ref 11.5–15.5)
WBC: 3.9 10*3/uL — ABNORMAL LOW (ref 4.0–10.5)

## 2013-06-18 LAB — TSH: TSH: 1.159 u[IU]/mL (ref 0.350–4.500)

## 2013-06-18 NOTE — Progress Notes (Signed)
  Subjective:    Patient ID: Jeffery Franklin, male    DOB: [birth year], 62 y.o.   MRN: [id number]  HPI He is here for a get acquainted visit. He notes that over the last 6-12 months he has had increased difficulty with fatigue, sleep disturbance, decreased appetite and a less than 10 pound weight loss. He also feels that he is just disinterested. His home life is going well except for the fact that his partner's parents are apparently in poor health. Work is going well. His libido is down slightly but he is having no difficulty with erections. Review his record indicates he has a previous history of osteoporosis. He did appear to have a DEXA scan done within the last several years apparently was normal. Does smoke. Review of Systems     Objective:   Physical Exam Alert and in no distress. Skin is normal. The DTRs are normal. The rest of the exam was deferred. Review of his medical record indicates he has a very low there is somewhat tighter area      Assessment & Plan:  Need for prophylactic vaccination and inoculation against influenza - Plan: Flu Vaccine QUAD 36+ mos PF IM (Fluarix)  OSTEOPOROSIS - Plan: Vit D  25 hydroxy (rtn osteoporosis monitoring)  Fatigue - Plan: CBC with Differential, Comprehensive metabolic panel, Testosterone, TSH  Loss of weight - Plan: CBC with Differential, Comprehensive metabolic panel, Testosterone, TSH  SMOKER  Immunization due - Plan: Varicella-zoster vaccine subcutaneous  explained that his titer is low but I feel it appropriate to give him a Zostavax shot. I will get routine blood screening and followup pending results of this.

## 2013-06-19 LAB — VITAMIN D 25 HYDROXY (VIT D DEFICIENCY, FRACTURES): Vit D, 25-Hydroxy: 25 ng/mL — ABNORMAL LOW (ref 30–89)

## 2013-07-02 ENCOUNTER — Encounter: Payer: Self-pay | Admitting: Family Medicine

## 2013-07-07 ENCOUNTER — Encounter: Payer: Self-pay | Admitting: Gastroenterology

## 2013-07-07 ENCOUNTER — Ambulatory Visit (AMBULATORY_SURGERY_CENTER): Payer: 59 | Admitting: Gastroenterology

## 2013-07-07 VITALS — BP 143/80 | HR 60 | Temp 98.5°F | Resp 25 | Ht 70.0 in | Wt 143.0 lb

## 2013-07-07 DIAGNOSIS — Z1211 Encounter for screening for malignant neoplasm of colon: Secondary | ICD-10-CM

## 2013-07-07 DIAGNOSIS — D126 Benign neoplasm of colon, unspecified: Secondary | ICD-10-CM

## 2013-07-07 DIAGNOSIS — Z86010 Personal history of colonic polyps: Secondary | ICD-10-CM

## 2013-07-07 MED ORDER — SODIUM CHLORIDE 0.9 % IV SOLN: 500.0000 mL | INTRAVENOUS | Status: DC

## 2013-07-07 NOTE — Patient Instructions (Signed)
Discharge instructions given with verbal understanding. Handouts on polyps and hemorrhoids. Resume previous medications. YOU HAD AN ENDOSCOPIC PROCEDURE TODAY AT THE [facility]: Refer to the procedure report that was given to you for any specific questions about what was found during the examination.  If the procedure report does not answer your questions, please call your gastroenterologist to clarify.  If you requested that your care partner not be given the details of your procedure findings, then the procedure report has been included in a sealed envelope for you to review at your convenience later.  YOU SHOULD EXPECT: Some feelings of bloating in the abdomen. Passage of more gas than usual.  Walking can help get rid of the air that was put into your GI tract during the procedure and reduce the bloating. If you had a lower endoscopy (such as a colonoscopy or flexible sigmoidoscopy) you may notice spotting of blood in your stool or on the toilet paper. If you underwent a bowel prep for your procedure, then you may not have a normal bowel movement for a few days.  DIET: Your first meal following the procedure should be a light meal and then it is ok to progress to your normal diet.  A half-sandwich or bowl of soup is an example of a good first meal.  Heavy or fried foods are harder to digest and may make you feel nauseous or bloated.  Likewise meals heavy in dairy and vegetables can cause extra gas to form and this can also increase the bloating.  Drink plenty of fluids but you should avoid alcoholic beverages for 24 hours.  ACTIVITY: Your care partner should take you home directly after the procedure.  You should plan to take it easy, moving slowly for the rest of the day.  You can resume normal activity the day after the procedure however you should NOT DRIVE or use heavy machinery for 24 hours (because of the sedation medicines used during the test).    SYMPTOMS TO REPORT  IMMEDIATELY: A gastroenterologist can be reached at any hour.  During normal business hours, 8:30 AM to 5:00 PM Monday through Friday, call [phone].  After hours and on weekends, please call the GI answering service at [phone] who will take a message and have the physician on call contact you.   Following lower endoscopy (colonoscopy or flexible sigmoidoscopy):  Excessive amounts of blood in the stool  Significant tenderness or worsening of abdominal pains  Swelling of the abdomen that is new, acute  Fever of 100F or higher  Following upper endoscopy (EGD)  Vomiting of blood or coffee ground material  New chest pain or pain under the shoulder blades  Painful or persistently difficult swallowing  New shortness of breath  Fever of 100F or higher  Black, tarry-looking stools  FOLLOW UP: If any biopsies were taken you will be contacted by phone or by letter within the next 1-3 weeks.  Call your gastroenterologist if you have not heard about the biopsies in 3 weeks.  Our staff will call the home number listed on your records the next business day following your procedure to check on you and address any questions or concerns that you may have at that time regarding the information given to you following your procedure. This is a courtesy call and so if there is no answer at the home number and we have not heard from you through the emergency physician on call, we will assume that you have   returned to your regular daily activities without incident.  SIGNATURES/CONFIDENTIALITY: You and/or your care partner have signed paperwork which will be entered into your electronic medical record.  These signatures attest to the fact that that the information above on your After Visit Summary has been reviewed and is understood.  Full responsibility of the confidentiality of this discharge information lies with you and/or your care-partner.  

## 2013-07-07 NOTE — Progress Notes (Signed)
Called to room to assist during endoscopic procedure.  Patient ID and intended procedure confirmed with present staff. Received instructions for my participation in the procedure from the performing physician. ewm 

## 2013-07-07 NOTE — Progress Notes (Signed)
Patient did not experience any of the following events: a burn prior to discharge; a fall within the facility; wrong site/side/patient/procedure/implant event; or a hospital transfer or hospital admission upon discharge from the facility. (G8907) Patient did not have preoperative order for IV antibiotic SSI prophylaxis. (G8918)  

## 2013-07-07 NOTE — Progress Notes (Signed)
Procedure ends, to recovery, report given and VSS. 

## 2013-07-07 NOTE — Op Note (Signed)
[facility] [address] [place], [address]   COLONOSCOPY PROCEDURE REPORT  PATIENT: Jeffery Franklin, Jeffery Franklin  MR#: [id number] BIRTHDATE: 1950-10-11 , 62  yrs. old GENDER: Male ENDOSCOPIST: Meryl Dare, MD, [facility] PROCEDURE DATE:  07/07/2013 PROCEDURE:   Colonoscopy with snare polypectomy First Screening Colonoscopy - Avg.  risk and is 50 yrs.  old or older - No.  Prior Negative Screening - Now for repeat screening. 10 or more years since last screening  History of Adenoma - Now for follow-up colonoscopy & has been > or = to 3 yrs.  N/A  Polyps Removed Today? Yes. ASA CLASS:   Class II INDICATIONS:average risk screening. MEDICATIONS: MAC sedation, administered by CRNA and propofol (Diprivan) 230mg  IV DESCRIPTION OF PROCEDURE:   After the risks benefits and alternatives of the procedure were thoroughly explained, informed consent was obtained.  A digital rectal exam revealed a small external hemorrhoid.   The [id number]  endoscope was introduced through the anus and advanced to the cecum, which was identified by both the appendix and ileocecal valve. No adverse events experienced.   The quality of the prep was good, using MoviPrep  The instrument was then slowly withdrawn as the colon was fully examined.  COLON FINDINGS: A sessile polyp measuring 4 mm in size was found in the sigmoid colon.  A polypectomy was performed with a cold snare. The resection was complete and the polyp tissue was completely retrieved.   The colon was otherwise normal.  There was no diverticulosis, inflammation, polyps or cancers unless previously stated.  Retroflexed views revealed small internal hemorrhoids. The time to cecum=3 minutes 25 seconds.  Withdrawal time=11 minutes 07 seconds.  The scope was withdrawn and the procedure completed. COMPLICATIONS: There were no complications.  ENDOSCOPIC IMPRESSION: 1.   Sessile polyp measuring 4 mm in the sigmoid colon;  polypectomy performed with a cold snare 2.   Small internal and external hemorrhoid  RECOMMENDATIONS: 1.  Await pathology results 2.  Repeat colonoscopy in 5 years if polyp adenomatous; otherwise 10 years  eSigned:  Meryl Dare, MD, [facility] 07/07/2013 9:01 AM

## 2013-07-08 ENCOUNTER — Telehealth: Payer: Self-pay

## 2013-07-08 NOTE — Telephone Encounter (Signed)
No answer, left message

## 2013-07-15 ENCOUNTER — Encounter: Payer: Self-pay | Admitting: Gastroenterology

## 2013-07-21 ENCOUNTER — Ambulatory Visit (INDEPENDENT_AMBULATORY_CARE_PROVIDER_SITE_OTHER): Payer: 59 | Admitting: Family Medicine

## 2013-07-21 ENCOUNTER — Encounter: Payer: Self-pay | Admitting: Family Medicine

## 2013-07-21 VITALS — BP 120/76 | HR 77 | Wt 138.0 lb

## 2013-07-21 DIAGNOSIS — M25512 Pain in left shoulder: Secondary | ICD-10-CM

## 2013-07-21 DIAGNOSIS — K4090 Unilateral inguinal hernia, without obstruction or gangrene, not specified as recurrent: Secondary | ICD-10-CM

## 2013-07-21 DIAGNOSIS — M25519 Pain in unspecified shoulder: Secondary | ICD-10-CM

## 2013-07-21 NOTE — Progress Notes (Signed)
PATIENT HAS APPOINTMENT August 06 2013 AT 11 AM WITH DR.GROSS AT [facility]

## 2013-07-21 NOTE — Progress Notes (Signed)
   Subjective:    Patient ID: Jeffery Franklin, male    DOB: 1950/12/02, 62 y.o.   MRN: [id number]  HPI Approximately 10 days ago he noted left shoulder pain in the deltoid area. He notes that neck flexion makes the pain goes away and extension brings on. He has no neck pain though. No numbness, tingling or weakness to He also has a history of left inguinal hernia and wants to discuss further care.  Review of Systems     Objective:   Physical Exam Full motion of the neck with some reproduction of deltoid pain with extension of his neck. No palpable tenderness in his neck. Normal motor, sensory and DTRs of the arms. Genital exam shows a large left inguinal hernia.       Assessment & Plan:  Left shoulder pain  Left inguinal hernia - Plan: Ambulatory referral to General Surgery  I will refer to Gen. surgery to discuss repair of the hernia. Discussed treatment of his left shoulder pain. It does seem to be radicular in nature but causing no deficit and he is having no neck pain. Discussed this in detail with him and recommended conservative care. He is comfortable with that.

## 2013-08-06 ENCOUNTER — Ambulatory Visit (INDEPENDENT_AMBULATORY_CARE_PROVIDER_SITE_OTHER): Payer: Self-pay | Admitting: Surgery

## 2013-08-08 ENCOUNTER — Encounter: Payer: Self-pay | Admitting: Family Medicine

## 2013-08-12 ENCOUNTER — Ambulatory Visit (INDEPENDENT_AMBULATORY_CARE_PROVIDER_SITE_OTHER): Payer: 59 | Admitting: Family Medicine

## 2013-08-12 ENCOUNTER — Encounter: Payer: Self-pay | Admitting: Family Medicine

## 2013-08-12 VITALS — BP 126/80 | HR 68 | Wt 141.0 lb

## 2013-08-12 DIAGNOSIS — B9789 Other viral agents as the cause of diseases classified elsewhere: Secondary | ICD-10-CM

## 2013-08-12 DIAGNOSIS — J069 Acute upper respiratory infection, unspecified: Secondary | ICD-10-CM

## 2013-08-12 DIAGNOSIS — F17200 Nicotine dependence, unspecified, uncomplicated: Secondary | ICD-10-CM

## 2013-08-12 MED ORDER — ALBUTEROL SULFATE HFA 108 (90 BASE) MCG/ACT IN AERS: 2.0000 | INHALATION_SPRAY | Freq: Four times a day (QID) | RESPIRATORY_TRACT | Status: DC | PRN

## 2013-08-12 NOTE — Progress Notes (Signed)
   Subjective:    Patient ID: Jeffery Franklin, male    DOB: 1950/11/29, 64 y.o.   MRN: [id number]  HPI He has a one-week history that started with fever, chills and nasal congestion. He then developed a cough with chest tightness several days ago. Also her throat, earache, myalgias. He does note when he takes a deep breath, the pain worsens. He quit smoking about 4 days ago. He now realizes that his smoking was more out of habit than truly addiction. He has tried OTC cough suppressants with minimal relief.   Review of Systems     Objective:   Physical Exam alert and in no distress. Tympanic membranes and canals are normal. Throat is clear. Tonsils are normal. Neck is supple without adenopathy or thyromegaly. Cardiac exam shows a regular sinus rhythm without murmurs or gallops. Lungs are clear to auscultation.        Assessment & Plan:  SMOKER  Viral URI with cough - Plan: albuterol (PROVENTIL HFA;VENTOLIN HFA) 108 (90 BASE) MCG/ACT inhaler  I will have him use the inhaler as needed for the coughing. He is comfortable with this approach. Also discussed smoking cessation in great detail. Encouraged him to make a list of things to do instead of smoking. He does realize that he needs to deal with the triggers. Discussed things that might make him start smoking again including having just one cigarette and stress-related issues. He is to return here if he has difficulties with that. Over 20 minutes spent discussing all these issues with him.

## 2013-08-13 ENCOUNTER — Encounter (INDEPENDENT_AMBULATORY_CARE_PROVIDER_SITE_OTHER): Payer: Self-pay | Admitting: Surgery

## 2013-08-13 ENCOUNTER — Ambulatory Visit (INDEPENDENT_AMBULATORY_CARE_PROVIDER_SITE_OTHER): Payer: 59 | Admitting: Surgery

## 2013-08-13 VITALS — BP 128/86 | HR 76 | Temp 98.6°F | Resp 14 | Ht 71.0 in | Wt 139.8 lb

## 2013-08-13 DIAGNOSIS — K4090 Unilateral inguinal hernia, without obstruction or gangrene, not specified as recurrent: Secondary | ICD-10-CM

## 2013-08-13 DIAGNOSIS — K4020 Bilateral inguinal hernia, without obstruction or gangrene, not specified as recurrent: Secondary | ICD-10-CM | POA: Insufficient documentation

## 2013-08-13 NOTE — Progress Notes (Signed)
Subjective:     Patient ID: Jeffery Franklin, male   DOB: [birth year], 63 y.o.   MRN: [id number]  HPI  Note: This dictation was prepared with Dragon/digital dictation along with [facility] technology. Any transcriptional errors that result from this process are unintentional.       GIANFRANCO ARAKI  1951/06/04 [id number]  Patient Care Team: Denita Lung, MD as PCP - General (Family Medicine)  This patient is a 63 y.o.male who presents today for surgical evaluation at the request of Dr. Redmond School.   Reason for visit: Groin bulge, probable hernia.  Pleasant active man.  A light smoker.  Recently quit.  He has noticed a bulge in his left groin for the past year.  It has gotten slightly larger.  Is not particularly bothersome, however he will get occasional sharp pains with strenuous activity.  He was concerned for hernia.  His primary care physician agree.  Surgical consultation requested.  He is rather active.  He can walk 30 minutes without difficulty.  No prior hernias or abdominal surgery.  Gets regular colonoscopies by Dr. Fuller Plan.  Has a bowel movement 1-2 times a day.  No history of skin infections nor MRSA.  Patient Active Problem List   Diagnosis Date Noted  . HTN (hypertension) 10/18/2012  . SMOKER 01/17/2010  . OSTEOPOROSIS 03/26/2007    Past Medical History  Diagnosis Date  . Osteoporosis     osteopenia in past  . Hypertension   . Hyperlipidemia   . Basal cell carcinoma     right temple/ 2013    Past Surgical History  Procedure Laterality Date  . Colonoscopy    . Skin cancer removed      right temple    History   Social History  . Marital Status: Single    Spouse Name: N/A    Number of Children: N/A  . Years of Education: N/A   Occupational History  . Not on file.   Social History Main Topics  . Smoking status: Former Smoker -- 0.50 packs/day    Types: Cigarettes    Quit date: 07/27/2013  . Smokeless tobacco: Never Used  . Alcohol Use: 8.4 oz/week   14 Glasses of wine per week  . Drug Use: No  . Sexual Activity: Not on file   Other Topics Concern  . Not on file   Social History Narrative  . No narrative on file    Family History  Problem Relation Age of Onset  . Diabetes    . Hypertension    . Prostate cancer    . Heart disease    . Heart disease Mother   . Heart disease Father   . Diabetes Father   . Prostate cancer Father   . Cancer Father     protstate  . Diabetes Sister   . Heart disease Brother   . Cancer Brother     prostate  . Prostate cancer Brother     Current Outpatient Prescriptions  Medication Sig Dispense Refill  . albuterol (PROVENTIL HFA;VENTOLIN HFA) 108 (90 BASE) MCG/ACT inhaler Inhale 2 puffs into the lungs every 6 (six) hours as needed for wheezing or shortness of breath.  1 Inhaler  0  . telmisartan (MICARDIS) 40 MG tablet Take 1 tablet (40 mg total) by mouth daily.  90 tablet  3   No current facility-administered medications for this visit.     No Known Allergies  BP 128/86  Pulse 76  Temp(Src) 98.6 F (37 C) (  Temporal)  Resp 14  Ht 5\' 11"  (1.803 m)  Wt 139 lb 12.8 oz (63.413 kg)  BMI 19.51 kg/m2  No results found.   Review of Systems  Constitutional: Negative for fever, chills and diaphoresis.  HENT: Negative for ear discharge, facial swelling, mouth sores, nosebleeds, sore throat and trouble swallowing.   Eyes: Negative for photophobia, discharge and visual disturbance.  Respiratory: Positive for cough. Negative for choking, chest tightness, shortness of breath and stridor.   Cardiovascular: Negative for chest pain and palpitations.  Gastrointestinal: Negative for nausea, vomiting, abdominal pain, diarrhea, constipation, blood in stool, abdominal distention, anal bleeding and rectal pain.  Endocrine: Negative for cold intolerance and heat intolerance.  Genitourinary: Negative for dysuria, urgency, difficulty urinating and testicular pain.  Musculoskeletal: Negative for  arthralgias, back pain, gait problem and myalgias.  Skin: Negative for color change, pallor, rash and wound.  Allergic/Immunologic: Negative for environmental allergies and food allergies.  Neurological: Negative for dizziness, speech difficulty, weakness, numbness and headaches.  Hematological: Negative for adenopathy. Does not bruise/bleed easily.  Psychiatric/Behavioral: Negative for hallucinations, confusion and agitation.       Objective:   Physical Exam  Constitutional: He is oriented to person, place, and time. He appears well-developed and well-nourished. No distress.  HENT:  Head: Normocephalic.  Mouth/Throat: Oropharynx is clear and moist. No oropharyngeal exudate.  Eyes: Conjunctivae and EOM are normal. Pupils are equal, round, and reactive to light. No scleral icterus.  Neck: Normal range of motion. Neck supple. No tracheal deviation present.  Cardiovascular: Normal rate, regular rhythm and intact distal pulses.   Pulmonary/Chest: Effort normal and breath sounds normal. No respiratory distress.  Abdominal: Soft. He exhibits no distension. There is no tenderness. A hernia is present. Hernia confirmed positive in the left inguinal area.  Genitourinary: Testes normal and penis normal.    Circumcised.  Musculoskeletal: Normal range of motion. He exhibits no tenderness.  Lymphadenopathy:    He has no cervical adenopathy.       Right: No inguinal adenopathy present.       Left: No inguinal adenopathy present.  Neurological: He is alert and oriented to person, place, and time. No cranial nerve deficit. He exhibits normal muscle tone. Coordination normal.  Skin: Skin is warm and dry. No rash noted. He is not diaphoretic. No erythema. No pallor.  Psychiatric: He has a normal mood and affect. His behavior is normal. Judgment and thought content normal.       Assessment:     Left inguinal hernia     Plan:     I recommended laparoscopic exploration and fix at least the left  side.  Make sure he does not have one on the right:  The anatomy & physiology of the abdominal wall and pelvic floor was discussed.  The pathophysiology of hernias in the inguinal and pelvic region was discussed.  Natural history risks such as progressive enlargement, pain, incarceration & strangulation was discussed.   Contributors to complications such as smoking, obesity, diabetes, prior surgery, etc were discussed.    I feel the risks of no intervention will lead to serious problems that outweigh the operative risks; therefore, I recommended surgery to reduce and repair the hernia.  I explained laparoscopic techniques with possible need for an open approach.  I noted usual use of mesh to patch and/or buttress hernia repair  Risks such as bleeding, infection, abscess, need for further treatment, heart attack, death, and other risks were discussed.  I noted a good likelihood  this will help address the problem.   Goals of post-operative recovery were discussed as well.  Possibility that this will not correct all symptoms was explained.  I stressed the importance of low-impact activity, aggressive pain control, avoiding constipation, & not pushing through pain to minimize risk of post-operative chronic pain or injury. Possibility of reherniation was discussed.  We will work to minimize complications.     An educational handout further explaining the pathology & treatment options was given as well.  Questions were answered.  The patient expresses understanding & wishes to proceed with surgery.

## 2013-08-13 NOTE — Patient Instructions (Signed)
Hernia A hernia occurs when an internal organ pushes out through a weak spot in the abdominal wall. Hernias most commonly occur in the groin and around the navel. Hernias often can be pushed back into place (reduced). Most hernias tend to get worse over time. Some abdominal hernias can get stuck in the opening (irreducible or incarcerated hernia) and cannot be reduced. An irreducible abdominal hernia which is tightly squeezed into the opening is at risk for impaired blood supply (strangulated hernia). A strangulated hernia is a medical emergency. Because of the risk for an irreducible or strangulated hernia, surgery may be recommended to repair a hernia. CAUSES   Heavy lifting.  Prolonged coughing.  Straining to have a bowel movement.  A cut (incision) made during an abdominal surgery. HOME CARE INSTRUCTIONS   Bed rest is not required. You may continue your normal activities.  Avoid lifting more than 10 pounds (4.5 kg) or straining.  Cough gently. If you are a smoker it is best to stop. Even the best hernia repair can break down with the continual strain of coughing. Even if you do not have your hernia repaired, a cough will continue to aggravate the problem.  Do not wear anything tight over your hernia. Do not try to keep it in with an outside bandage or truss. These can damage abdominal contents if they are trapped within the hernia sac.  Eat a normal diet.  Avoid constipation. Straining over long periods of time will increase hernia size and encourage breakdown of repairs. If you cannot do this with diet alone, stool softeners may be used. SEEK IMMEDIATE MEDICAL CARE IF:   You have a fever.  You develop increasing abdominal pain.  You feel nauseous or vomit.  Your hernia is stuck outside the abdomen, looks discolored, feels hard, or is tender.  You have any changes in your bowel habits or in the hernia that are unusual for you.  You have increased pain or swelling around the  hernia.  You cannot push the hernia back in place by applying gentle pressure while lying down. MAKE SURE YOU:   Understand these instructions.  Will watch your condition.  Will get help right away if you are not doing well or get worse. Document Released: 07/17/2005 Document Revised: 10/09/2011 Document Reviewed: 03/05/2008 [facility] Patient Information 2014 [place].  HERNIA REPAIR: POST OP INSTRUCTIONS  1. DIET: Follow a light bland diet the first 24 hours after arrival home, such as soup, liquids, crackers, etc.  Be sure to include lots of fluids daily.  Avoid fast food or heavy meals as your are more likely to get nauseated.  Eat a low fat the next few days after surgery. 2. Take your usually prescribed home medications unless otherwise directed. 3. PAIN CONTROL: a. Pain is best controlled by a usual combination of three different methods TOGETHER: i. Ice/Heat ii. Over the counter pain medication iii. Prescription pain medication b. Most patients will experience some swelling and bruising around the hernia(s) such as the bellybutton, groins, or old incisions.  Ice packs or heating pads (30-60 minutes up to 6 times a day) will help. Use ice for the first few days to help decrease swelling and bruising, then switch to heat to help relax tight/sore spots and speed recovery.  Some people prefer to use ice alone, heat alone, alternating between ice & heat.  Experiment to what works for you.  Swelling and bruising can take several weeks to resolve.   c. It is helpful  to take an over-the-counter pain medication regularly for the first few weeks.  Choose one of the following that works best for you: i. Naproxen (Aleve, etc)  Two 220mg  tabs twice a day ii. Ibuprofen (Advil, etc) Three 200mg  tabs four times a day (every meal & bedtime) iii. Acetaminophen (Tylenol, etc) 325-650mg  four times a day (every meal & bedtime) d. A  prescription for pain medication should be given to you upon  discharge.  Take your pain medication as prescribed.  i. If you are having problems/concerns with the prescription medicine (does not control pain, nausea, vomiting, rash, itching, etc), please call us [phone] to see if we need to switch you to a different pain medicine that will work better for you and/or control your side effect better. ii. If you need a refill on your pain medication, please contact your pharmacy.  They will contact our office to request authorization. Prescriptions will not be filled after 5 pm or on week-ends. 4. Avoid getting constipated.  Between the surgery and the pain medications, it is common to experience some constipation.  Increasing fluid intake and taking a fiber supplement (such as Metamucil, Citrucel, FiberCon, MiraLax, etc) 1-2 times a day regularly will usually help prevent this problem from occurring.  A mild laxative (prune juice, Milk of Magnesia, MiraLax, etc) should be taken according to package directions if there are no bowel movements after 48 hours.   5. Wash / shower every day.  You may shower over the dressings as they are waterproof.   6. Remove your waterproof bandages 5 days after surgery.  You may leave the incision open to air.  You may replace a dressing/Band-Aid to cover the incision for comfort if you wish.  Continue to shower over incision(s) after the dressing is off.    7. ACTIVITIES as tolerated:   a. You may resume regular (light) daily activities beginning the next day-such as daily self-care, walking, climbing stairs-gradually increasing activities as tolerated.  If you can walk 30 minutes without difficulty, it is safe to try more intense activity such as jogging, treadmill, bicycling, low-impact aerobics, swimming, etc. b. Save the most intensive and strenuous activity for last such as sit-ups, heavy lifting, contact sports, etc  Refrain from any heavy lifting or straining until you are off narcotics for pain control.   c. DO NOT  PUSH THROUGH PAIN.  Let pain be your guide: If it hurts to do something, don't do it.  Pain is your body warning you to avoid that activity for another week until the pain goes down. d. You may drive when you are no longer taking prescription pain medication, you can comfortably wear a seatbelt, and you can safely maneuver your car and apply brakes. e. Dennis Bast may have sexual intercourse when it is comfortable.  8. FOLLOW UP in our office a. Please call [facility] at [phone] to set up an appointment to see your surgeon in the office for a follow-up appointment approximately 2-3 weeks after your surgery. b. Make sure that you call for this appointment the day you arrive home to insure a convenient appointment time. 9.  IF YOU HAVE DISABILITY OR FAMILY LEAVE FORMS, BRING THEM TO THE OFFICE FOR PROCESSING.  DO NOT GIVE THEM TO YOUR DOCTOR.  WHEN TO CALL us [phone]: 1. Poor pain control 2. Reactions / problems with new medications (rash/itching, nausea, etc)  3. Fever over 101.5 F (38.5 C) 4. Inability to urinate 5. Nausea and/or vomiting 6.  Worsening swelling or bruising 7. Continued bleeding from incision. 8. Increased pain, redness, or drainage from the incision   The clinic staff is available to answer your questions during regular business hours (8:30am-5pm).  Please don't hesitate to call and ask to speak to one of our nurses for clinical concerns.   If you have a medical emergency, go to the nearest emergency room or call 911.  A surgeon from [facility] is always on call at the hospitals in [facility], [place], [place], [place], [place]  [address] ?  [address] [address], [place], [place]   [address] MAIN: [phone] ? TOLL FREE: [phone] ? FAX: [phone] [email or web address]  GETTING TO GOOD BOWEL HEALTH. Irregular bowel habits such as constipation and diarrhea can lead to many problems over time.  Having one  soft bowel movement a day is the most important way to prevent further problems.  The anorectal canal is designed to handle stretching and feces to safely manage our ability to get rid of solid waste (feces, poop, stool) out of our body.  BUT, hard constipated stools can act like ripping concrete bricks and diarrhea can be a burning fire to this very sensitive area of our body, causing inflamed hemorrhoids, anal fissures, increasing risk is perirectal abscesses, abdominal pain/bloating, an making irritable bowel worse.     The goal: ONE SOFT BOWEL MOVEMENT A DAY!  To have soft, regular bowel movements:    Drink at least 8 tall glasses of water a day.     Take plenty of fiber.  Fiber is the undigested part of plant food that passes into the colon, acting s "natures broom" to encourage bowel motility and movement.  Fiber can absorb and hold large amounts of water. This results in a larger, bulkier stool, which is soft and easier to pass. Work gradually over several weeks up to 6 servings a day of fiber (25g a day even more if needed) in the form of: o Vegetables -- Root (potatoes, carrots, turnips), leafy green (lettuce, salad greens, celery, spinach), or cooked high residue (cabbage, broccoli, etc) o Fruit -- Fresh (unpeeled skin & pulp), Dried (prunes, apricots, cherries, etc ),  or stewed ( applesauce)  o Whole grain breads, pasta, etc (whole wheat)  o Bran cereals    Bulking Agents -- This type of water-retaining fiber generally is easily obtained each day by one of the following:  o Psyllium bran -- The psyllium plant is remarkable because its ground seeds can retain so much water. This product is available as Metamucil, Konsyl, Effersyllium, Per Diem Fiber, or the less expensive generic preparation in drug and health food stores. Although labeled a laxative, it really is not a laxative.  o Methylcellulose -- This is another fiber derived from wood which also retains water. It is available as  Citrucel. o Polyethylene Glycol - and "artificial" fiber commonly called Miralax or Glycolax.  It is helpful for people with gassy or bloated feelings with regular fiber o Flax Seed - a less gassy fiber than psyllium   No reading or other relaxing activity while on the toilet. If bowel movements take longer than 5 minutes, you are too constipated   AVOID CONSTIPATION.  High fiber and water intake usually takes care of this.  Sometimes a laxative is needed to stimulate more frequent bowel movements, but    Laxatives are not a good long-term solution as it can wear the colon out. o Osmotics (  Milk of Magnesia, Fleets phosphosoda, Magnesium citrate, MiraLax, GoLytely) are safer than  o Stimulants (Senokot, Castor Oil, Dulcolax, Ex Lax)    o Do not take laxatives for more than 7days in a row.    IF SEVERELY CONSTIPATED, try a Bowel Retraining Program: o Do not use laxatives.  o Eat a diet high in roughage, such as bran cereals and leafy vegetables.  o Drink six (6) ounces of prune or apricot juice each morning.  o Eat two (2) large servings of stewed fruit each day.  o Take one (1) heaping tablespoon of a psyllium-based bulking agent twice a day. Use sugar-free sweetener when possible to avoid excessive calories.  o Eat a normal breakfast.  o Set aside 15 minutes after breakfast to sit on the toilet, but do not strain to have a bowel movement.  o If you do not have a bowel movement by the third day, use an enema and repeat the above steps.    Controlling diarrhea o Switch to liquids and simpler foods for a few days to avoid stressing your intestines further. o Avoid dairy products (especially milk & ice cream) for a short time.  The intestines often can lose the ability to digest lactose when stressed. o Avoid foods that cause gassiness or bloating.  Typical foods include beans and other legumes, cabbage, broccoli, and dairy foods.  Every person has some sensitivity to other foods, so listen to our  body and avoid those foods that trigger problems for you. o Adding fiber (Citrucel, Metamucil, psyllium, Miralax) gradually can help thicken stools by absorbing excess fluid and retrain the intestines to act more normally.  Slowly increase the dose over a few weeks.  Too much fiber too soon can backfire and cause cramping & bloating. o Probiotics (such as active yogurt, Align, etc) may help repopulate the intestines and colon with normal bacteria and calm down a sensitive digestive tract.  Most studies show it to be of mild help, though, and such products can be costly. o Medicines:   Bismuth subsalicylate (ex. Kayopectate, Pepto Bismol) every 30 minutes for up to 6 doses can help control diarrhea.  Avoid if pregnant.   Loperamide (Immodium) can slow down diarrhea.  Start with two tablets (4mg  total) first and then try one tablet every 6 hours.  Avoid if you are having fevers or severe pain.  If you are not better or start feeling worse, stop all medicines and call your doctor for advice o Call your doctor if you are getting worse or not better.  Sometimes further testing (cultures, endoscopy, X-ray studies, bloodwork, etc) may be needed to help diagnose and treat the cause of the diarrhea. o

## 2013-08-22 ENCOUNTER — Encounter: Payer: Self-pay | Admitting: Family Medicine

## 2013-08-22 ENCOUNTER — Ambulatory Visit (INDEPENDENT_AMBULATORY_CARE_PROVIDER_SITE_OTHER): Payer: 59 | Admitting: Family Medicine

## 2013-08-22 VITALS — BP 140/84 | HR 84 | Temp 98.6°F | Ht 69.5 in | Wt 138.0 lb

## 2013-08-22 DIAGNOSIS — R05 Cough: Secondary | ICD-10-CM

## 2013-08-22 DIAGNOSIS — R059 Cough, unspecified: Secondary | ICD-10-CM

## 2013-08-22 MED ORDER — BENZONATATE 200 MG PO CAPS: 200.0000 mg | ORAL_CAPSULE | Freq: Three times a day (TID) | ORAL | Status: DC | PRN

## 2013-08-22 NOTE — Progress Notes (Signed)
Chief Complaint  Patient presents with  . still coughing    pt has tried treating his symtpoms. tired from coughing. staying up all night from coughing. pt has taken albuterol inhaler, cough drops,    He was seen 1/13 after 4 days of symptoms with fevers, myalgias, and cough.  He was diagnosed with viral URI, and given an inhaler.  Everything resolved except for ongoing cough.  Dry is mostly dry, hacky.  Occasionally gets up phlegm that is whitish-yellow, thick.  Cough is worse at night, starting at 4pm.  Sleeps for 4 hrs and then is up coughing, and can't get back to sleep.  Taking deep breaths makes him cough more.  Inhaler hasn't helped.  Hasn't used OTC cough syrups, just lozenges.  Very mild SOB--feels like he can't take a deep breath or else it sets off coughing.  Having some pain in chest due to all the coughing.   Mucus from nose is clear.  Has some sniffles but no significant sinus or head congestion, no sinus pain. He recently quit smoking.  Past Medical History  Diagnosis Date  . Osteoporosis     osteopenia in past  . Hypertension   . Hyperlipidemia   . Basal cell carcinoma     right temple/ 2013   Past Surgical History  Procedure Laterality Date  . Colonoscopy    . Skin cancer removed      right temple   History   Social History  . Marital Status: Single    Spouse Name: N/A    Number of Children: N/A  . Years of Education: N/A   Occupational History  . Not on file.   Social History Main Topics  . Smoking status: Former Smoker -- 0.50 packs/day    Types: Cigarettes    Quit date: 07/27/2013  . Smokeless tobacco: Never Used  . Alcohol Use: 8.4 oz/week    14 Glasses of wine per week     Comment: 2 glasses of red wine daily  . Drug Use: No  . Sexual Activity: Not on file   Other Topics Concern  . Not on file   Social History Narrative  . No narrative on file   Outpatient Encounter Prescriptions as of 08/22/2013  Medication Sig  . B Complex-C (B-COMPLEX  WITH VITAMIN C) tablet Take 1 tablet by mouth daily.  Marland Kitchen telmisartan (MICARDIS) 40 MG tablet Take 1 tablet (40 mg total) by mouth daily.  Marland Kitchen albuterol (PROVENTIL HFA;VENTOLIN HFA) 108 (90 BASE) MCG/ACT inhaler Inhale 2 puffs into the lungs every 6 (six) hours as needed for wheezing or shortness of breath.   No Known Allergies  ROS:  Denies recent fevers, headaches, dizziness, nausea, vomiting.  Denies ear pain, sore throat, rash, bleeding/bruising.  See HPI  PHYSICAL EXAM: BP 140/84  Pulse 84  Temp(Src) 98.6 F (37 C) (Oral)  Ht 5' 9.5" (1.765 m)  Wt 138 lb (62.596 kg)  BMI 20.09 kg/m2 Well developed, pleasant male with frequent sniffling, no cough. HEENT:  PERRL, EOMI, conjunctiva clear.  TM's and EAC's normal. Nasal mucosa is moderately edematous with some thick white mucus seen in nares. Sinuses nontender.  OP clear Neck: no lymphadenopathy, thyromegaly or mass Heart: regular rate and rhythm without murmur Lungs: clear bilaterally, with good air movement; no wheezes with forced expiration. No rales or ronchi Skin: no rash Psych: normal mood, affect Neuro: alert, oriented, cranial nerves intact  ASSESSMENT/PLAN:  Cough - Plan: benzonatate (TESSALON) 200 MG capsule  Postviral cough.  Based on exam (nasal mucus and sniffling) suspect a component of postnasal drainage. No evidence of bacterial infection or wheezing.  Add Mucinex (plain or DM). Drink plenty of fluids Consider using claritin or zyrtec to help dry the nasal drainage (or coricidin) Consider doing sinus rinses (neti-pot or sinus rinse kit), once or twice daily (especially in the evening before bed) Try using the Tessalon prescription if needed for cough.  If you develop fevers, increasing discolored mucus, call us for an antibiotic. If cough is continuing to interfere with sleep, and these measures don't help, then call for possible codeine/hydrocodone syrup

## 2013-08-22 NOTE — Patient Instructions (Signed)
Postviral cough.  Based on exam (nasal mucus and sniffling) suspect a component of postnasal drainage. No evidence of bacterial infection or wheezing.  Add Mucinex (plain or DM). Drink plenty of fluids Consider using claritin or zyrtec to help dry the nasal drainage (or coricidin) Consider doing sinus rinses (neti-pot or sinus rinse kit), once or twice daily (especially in the evening before bed) Try using the Tessalon prescription if needed for cough.  If you develop fevers, increasing discolored mucus, call us for an antibiotic. If cough is continuing to interfere with sleep, and these measures don't help, then call for possible codeine/hydrocodone syrup

## 2013-08-26 ENCOUNTER — Telehealth: Payer: Self-pay | Admitting: Family Medicine

## 2013-08-26 NOTE — Telephone Encounter (Signed)
Patient is aware of Dr. Johnsie Kindred message. CLS

## 2013-08-26 NOTE — Telephone Encounter (Signed)
Hi Dr. Tomi Bamberger-- Now that we've gotten the upper respiratory stuff close to resolution, I would like to reconnect with you and John to talk about the sudden weight loss (10 lbs in ~6 mos), lean muscle mass wasting in arms and legs, low appetite, hair loss, etc. I saw a couple of family members over the weekend that I hadn't seen in six-eight months, and they were shocked. Thanks, Shanon Brow

## 2013-08-26 NOTE — Telephone Encounter (Signed)
Advise pt--I'm glad he is finally feeling better.  Upon review of chart, he lost about 5 pounds since early December (but weight unchanged/stable since just prior to Christmas), and another 5 pounds was lost between 09/2012 and 05/2013.  He had normal thyroid test in November.  Since he has significant concerns/symptoms it is probably worth scheduling an appointment with Dr. Redmond School to discuss (that is his regular physician, seen once by me last week for acute illness f/u)

## 2013-08-29 ENCOUNTER — Ambulatory Visit (INDEPENDENT_AMBULATORY_CARE_PROVIDER_SITE_OTHER): Payer: 59 | Admitting: Family Medicine

## 2013-08-29 ENCOUNTER — Encounter: Payer: Self-pay | Admitting: Family Medicine

## 2013-08-29 VITALS — BP 132/80 | HR 78 | Wt 133.0 lb

## 2013-08-29 DIAGNOSIS — R6881 Early satiety: Secondary | ICD-10-CM

## 2013-08-29 DIAGNOSIS — R634 Abnormal weight loss: Secondary | ICD-10-CM

## 2013-08-29 LAB — CBC WITH DIFFERENTIAL/PLATELET
BASOS ABS: 0 10*3/uL (ref 0.0–0.1)
BASOS PCT: 0 % (ref 0–1)
Eosinophils Absolute: 0 10*3/uL (ref 0.0–0.7)
Eosinophils Relative: 0 % (ref 0–5)
HCT: 30.1 % — ABNORMAL LOW (ref 39.0–52.0)
HEMOGLOBIN: 10.2 g/dL — AB (ref 13.0–17.0)
LYMPHS PCT: 16 % (ref 12–46)
Lymphs Abs: 1.1 10*3/uL (ref 0.7–4.0)
MCH: 30.2 pg (ref 26.0–34.0)
MCHC: 33.9 g/dL (ref 30.0–36.0)
MCV: 89.1 fL (ref 78.0–100.0)
MONO ABS: 0.6 10*3/uL (ref 0.1–1.0)
Monocytes Relative: 10 % (ref 3–12)
NEUTROS ABS: 4.7 10*3/uL (ref 1.7–7.7)
NEUTROS PCT: 74 % (ref 43–77)
Platelets: 410 10*3/uL — ABNORMAL HIGH (ref 150–400)
RBC: 3.38 MIL/uL — ABNORMAL LOW (ref 4.22–5.81)
RDW: 13.8 % (ref 11.5–15.5)
WBC: 6.5 10*3/uL (ref 4.0–10.5)

## 2013-08-29 LAB — COMPREHENSIVE METABOLIC PANEL
ALK PHOS: 73 U/L (ref 39–117)
ALT: 43 U/L (ref 0–53)
AST: 43 U/L — AB (ref 0–37)
Albumin: 2.9 g/dL — ABNORMAL LOW (ref 3.5–5.2)
BUN: 15 mg/dL (ref 6–23)
CALCIUM: 8.1 mg/dL — AB (ref 8.4–10.5)
CO2: 24 mEq/L (ref 19–32)
Chloride: 96 mEq/L (ref 96–112)
Creat: 0.74 mg/dL (ref 0.50–1.35)
Glucose, Bld: 108 mg/dL — ABNORMAL HIGH (ref 70–99)
Potassium: 4.5 mEq/L (ref 3.5–5.3)
SODIUM: 130 meq/L — AB (ref 135–145)
TOTAL PROTEIN: 7.2 g/dL (ref 6.0–8.3)
Total Bilirubin: 0.6 mg/dL (ref 0.2–1.2)

## 2013-08-29 NOTE — Progress Notes (Signed)
   Subjective:    Patient ID: Jeffery Franklin, male    DOB: 04-20-1951, 63 y.o.   MRN: [id number]  HPI He is here for consultation concerning weight loss. Review his record indicates that he has lost weight especially over the last several months. He has had recent blood work which showed only minor abnormalities. He had a colonoscopy in December of 2014 which showed a small sessile polyp. He describes early satiety over the last several months. His had no nausea, vomiting, abdominal pain. He has had decreased bowel movements and blames this on his decreased eating. He has recently had difficulty with upper respiratory symptoms but these have now essentially cleared.   Review of Systems     Objective:   Physical Exam Alert and in no distress. Cardiac exam shows regular rhythm without murmurs or gallops. Lungs are clear to auscultation. Abdominal exam shows high-pitched bowel sounds but no masses or tenderness. Of note is he had only coffee and orange juice morning Review of the record over the last several months indicates roughly a 10 pound weight loss      Assessment & Plan:  Loss of weight - Plan: CBC with Differential, Comprehensive metabolic panel, Ambulatory referral to Gastroenterology  Early satiety - Plan: CBC with Differential, Comprehensive metabolic panel, Ambulatory referral to Gastroenterology  in going to reorder labs even no there were drawn in November. I also think it's time to have GI get involved and possibly look at his gut.

## 2013-09-01 ENCOUNTER — Encounter: Payer: Self-pay | Admitting: Gastroenterology

## 2013-09-04 ENCOUNTER — Encounter: Payer: Self-pay | Admitting: Gastroenterology

## 2013-09-04 ENCOUNTER — Ambulatory Visit (INDEPENDENT_AMBULATORY_CARE_PROVIDER_SITE_OTHER): Payer: 59 | Admitting: Gastroenterology

## 2013-09-04 VITALS — BP 140/80 | HR 80 | Ht 69.5 in | Wt 133.6 lb

## 2013-09-04 DIAGNOSIS — Z87891 Personal history of nicotine dependence: Secondary | ICD-10-CM

## 2013-09-04 DIAGNOSIS — D649 Anemia, unspecified: Secondary | ICD-10-CM | POA: Insufficient documentation

## 2013-09-04 DIAGNOSIS — R634 Abnormal weight loss: Secondary | ICD-10-CM

## 2013-09-04 DIAGNOSIS — R6881 Early satiety: Secondary | ICD-10-CM | POA: Insufficient documentation

## 2013-09-04 NOTE — Patient Instructions (Signed)
You have been scheduled for an endoscopy with propofol. Please follow written instructions given to you at your visit today. If you use inhalers (even only as needed), please bring them with you on the day of your procedure. Your physician has requested that you go to www.[email or web address] and enter the access code given to you at your visit today. This web site gives a general overview about your procedure. However, you should still follow specific instructions given to you by our office regarding your preparation for the procedure.    You have been scheduled for a CT scan of the abdomen and pelvis at [place] ([address]---this is in the same building as Press photographer).   You are scheduled on 09/10/2013 at 2pm. You should arrive 15 minutes prior to your appointment time for registration. Please follow the written instructions below on the day of your exam:  WARNING: IF YOU ARE ALLERGIC TO IODINE/X-RAY DYE, PLEASE NOTIFY RADIOLOGY IMMEDIATELY AT [phone]! YOU WILL BE GIVEN A 13 HOUR PREMEDICATION PREP.  1) Do not eat or drink anything after 10am (4 hours prior to your test) 2) You have been given 2 bottles of oral contrast to drink. The solution may taste               better if refrigerated, but do NOT add ice or any other liquid to this solution. Shake             well before drinking.    Drink 1 bottle of contrast @ 12pm (2 hours prior to your exam)  Drink 1 bottle of contrast @ 1pm (1 hour prior to your exam)  You may take any medications as prescribed with a small amount of water except for the following: Metformin, Glucophage, Glucovance, Avandamet, Riomet, Fortamet, Actoplus Met, Janumet, Glumetza or Metaglip. The above medications must be held the day of the exam AND 48 hours after the exam.  The purpose of you drinking the oral contrast is to aid in the visualization of your intestinal tract. The contrast solution may cause some diarrhea. Before your exam is  started, you will be given a small amount of fluid to drink. Depending on your individual set of symptoms, you may also receive an intravenous injection of x-ray contrast/dye. Plan on being at [facility] for 30 minutes or long, depending on the type of exam you are having performed.  This test typically takes 30-45 minutes to complete.  If you have any questions regarding your exam or if you need to reschedule, you may call the CT department at [phone] between the hours of 8:00 am and 5:00 pm, Monday-Friday.  ________________________________________________________________________

## 2013-09-04 NOTE — Progress Notes (Signed)
09/04/2013 Jeffery Franklin [id number] February 14, 1951   History of Present Illness:  This is a pleasant 62 year old male who is known to Dr. Fuller Plan for previous colonoscopy. His colonoscopy was performed in December 2014 at which time he had one polyp which was removed but found to be only benign colonic mucosa with lymphoid aggregates on pathology. He also had internal and external hemorrhoids. He is being sent to our office today by his PCP, Dr. Redmond School, who called and spoke with me the other day. The patient has had a 16 pound weight loss recently, which is unintentional. He states that his weight had not changed more than a couple of pounds in either direction for the past 20 years. He also has a new anemia with recent hemoglobin of 10.2 grams.  Two months ago his Hgb was 12.5 g, and 10 months ago and throughout the last several years his hemoglobin was in the 13-14 gram range. He denies any sign of gastrointestinal bleeding including dark or bloody stools. He does admit to some occasional intermittent feelings of early satiety, but says this is not consistent. Says that he has never been a big eater.   Current Medications, Allergies, Past Medical History, Past Surgical History, Family History and Social History were reviewed in [employer] record.   Physical Exam: BP 140/80  Pulse 80  Ht 5' 9.5" (1.765 m)  Wt 133 lb 9.6 oz (60.601 kg)  BMI 19.45 kg/m2 General: Thin white male in no acute distress Head: Normocephalic and atraumatic Eyes  Sclerae anicteric, conjunctiva pink  Ears: Normal auditory acuity Lungs: Clear throughout to auscultation Heart: Regular rate and rhythm Abdomen: Soft, non-distended.  Normal bowel sounds.  Non-tender. Musculoskeletal: Symmetrical with no gross deformities  Extremities: No edema  Neurological: Alert oriented x 4, grossly non-focal Psychological:  Alert and cooperative. Normal mood and affect  Assessment and  Recommendations: -Unintentional weight loss of 16 pounds in a previous smoker:  Just quit smoking approximately 2 months ago after a 20 year history. -Early satiety:  Occasional, intermittent. -New anemia without evidence of gastrointestinal bleeding  *Patient's PCP, Dr. Redmond School, called and spoke with me the other day. We agreed that he should probably undergo endoscopy, and I believe he should have a CT of the chest, abdomen, and pelvis with contrast as well to rule out malignancy.

## 2013-09-04 NOTE — Progress Notes (Signed)
Reviewed and agree with management plan. Proceed with EGD and CT of chest/abd/pelvis.  Pricilla Riffle. Fuller Plan, MD [facility]

## 2013-09-05 ENCOUNTER — Telehealth: Payer: Self-pay | Admitting: Family Medicine

## 2013-09-05 NOTE — Telephone Encounter (Signed)
tammy left message that he will need to call GI to get approval

## 2013-09-10 ENCOUNTER — Ambulatory Visit (INDEPENDENT_AMBULATORY_CARE_PROVIDER_SITE_OTHER)
Admission: RE | Admit: 2013-09-10 | Discharge: 2013-09-10 | Disposition: A | Discharge status: Home / Self Care | Payer: 59 | Source: Ambulatory Visit | Attending: Gastroenterology | Admitting: Gastroenterology

## 2013-09-10 DIAGNOSIS — R634 Abnormal weight loss: Secondary | ICD-10-CM

## 2013-09-10 DIAGNOSIS — Z87891 Personal history of nicotine dependence: Secondary | ICD-10-CM

## 2013-09-10 DIAGNOSIS — R6881 Early satiety: Secondary | ICD-10-CM

## 2013-09-10 DIAGNOSIS — D649 Anemia, unspecified: Secondary | ICD-10-CM

## 2013-09-10 IMAGING — CT CT CHEST W/ CM
2 of 5 series · 15 of 36 positions shown, 18 images · IV contrast (omnipaque)
Comparison: None.

CLINICAL DATA: Early satiety, abdominal pain, weight loss. Prior
smoker.

EXAM:
CT CHEST, ABDOMEN, AND PELVIS WITH CONTRAST
TECHNIQUE: Multidetector CT imaging of the chest, abdomen and pelvis was
performed following the standard protocol during bolus
administration of intravenous contrast.
CONTRAST:  100mL OMNIPAQUE IOHEXOL 300 MG/ML  SOLN

[Series 2: cap with · axial · 0.69mm/px · z∈[-586,-30]mm · 12 of 127 slices shown, 15 images]
[im 8/127  mediastinal]
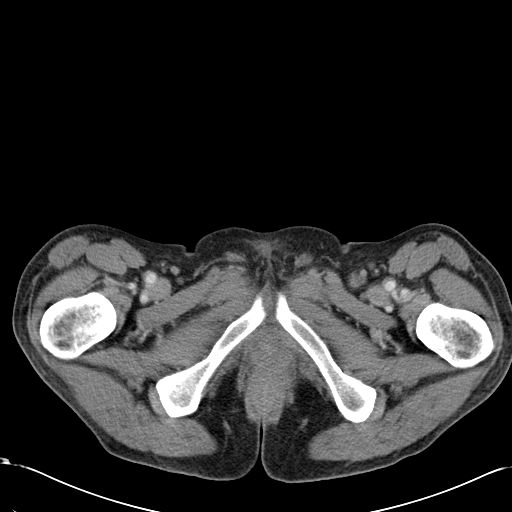
[im 8/127  lung]
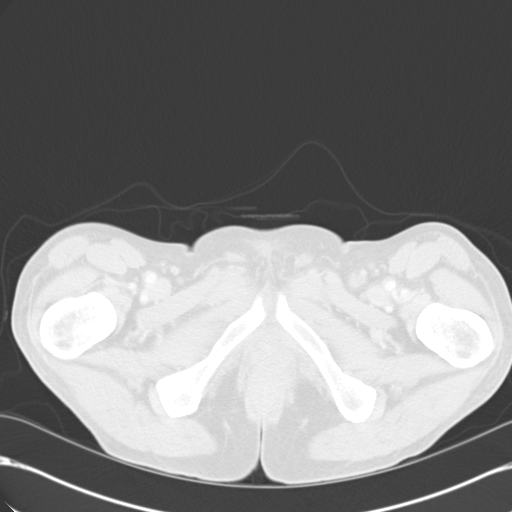
[im 16/127  lung]
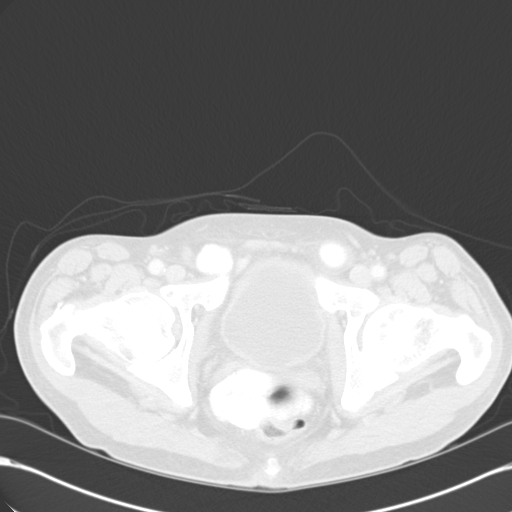
[im 32/127  lung]
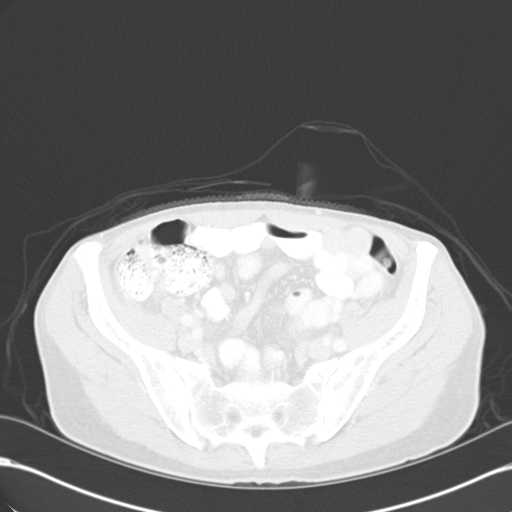
[im 40/127  lung]
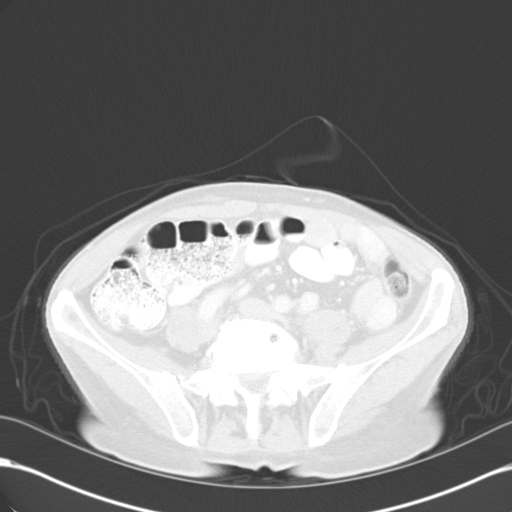
[im 48/127  mediastinal]
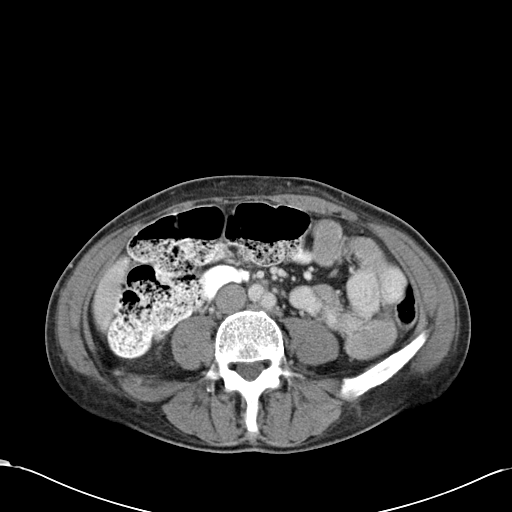
[im 48/127  lung]
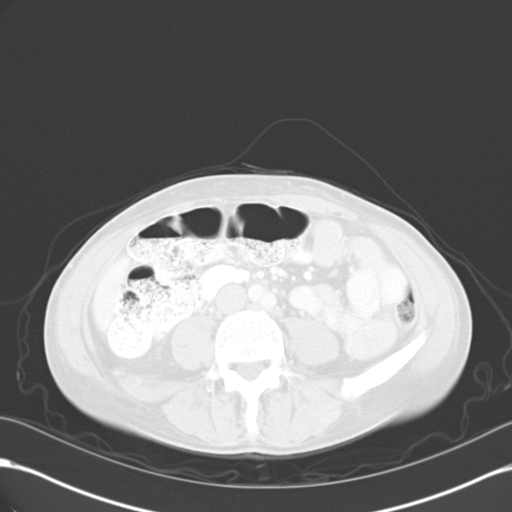
[im 56/127  lung]
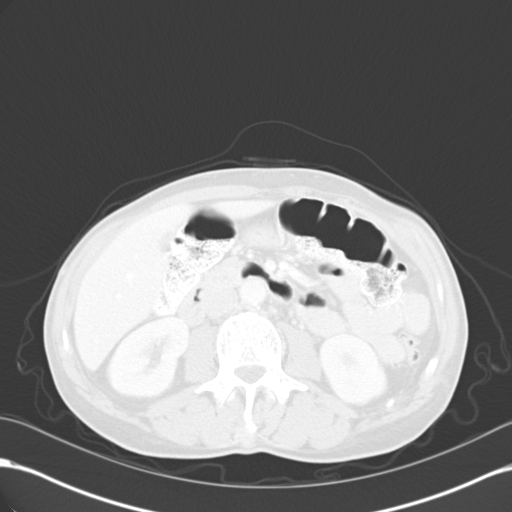
[im 71/127  lung]
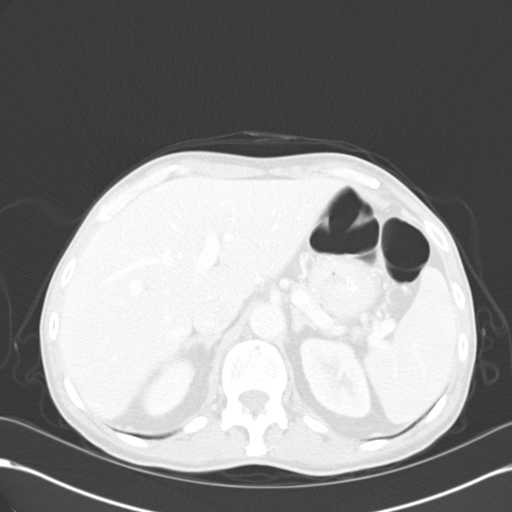
[im 79/127  lung]
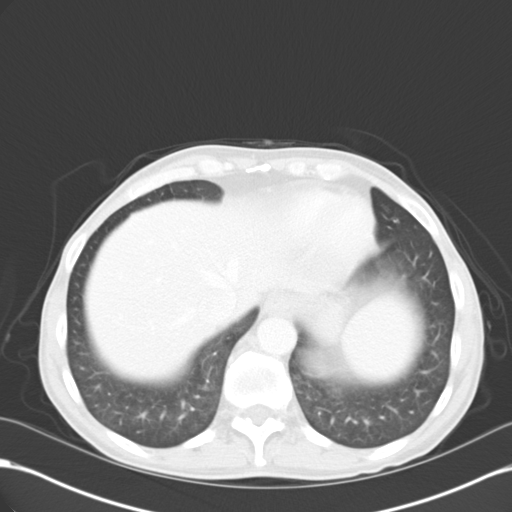
[im 87/127  mediastinal]
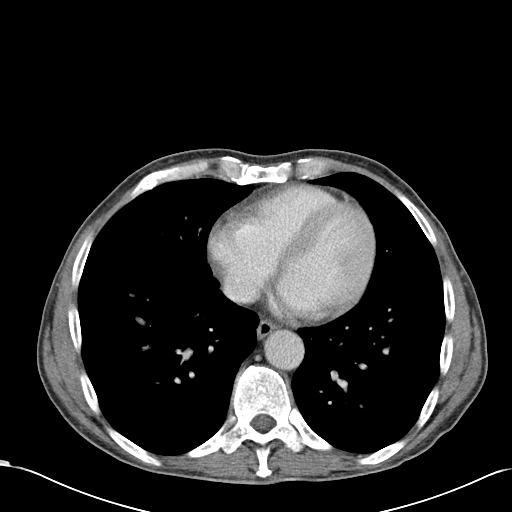
[im 87/127  lung]
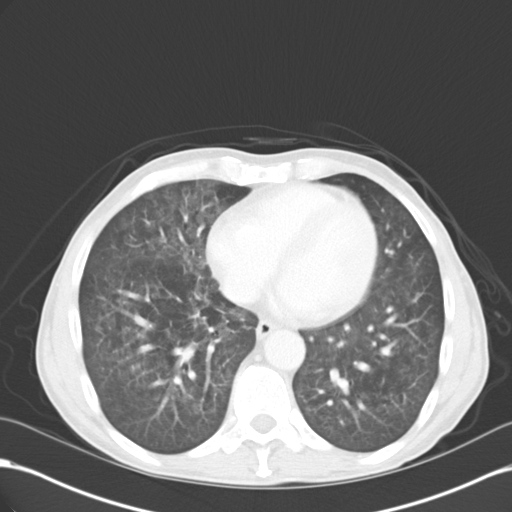
[im 95/127  lung]
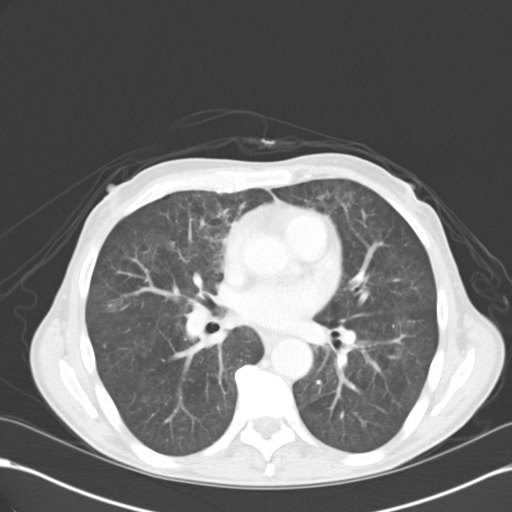
[im 111/127  lung]
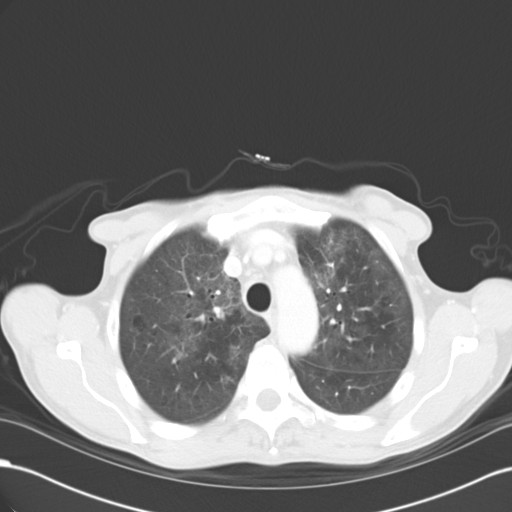
[im 119/127  lung]
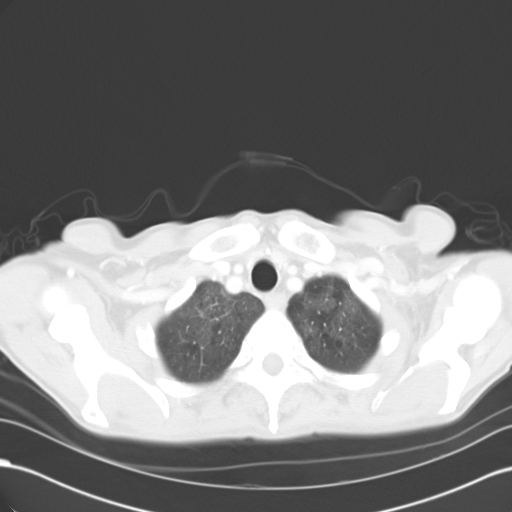

[Series 602: cor · coronal · 1.28mm/px · 3 of 112 slices shown]
[im 23/112  lung]
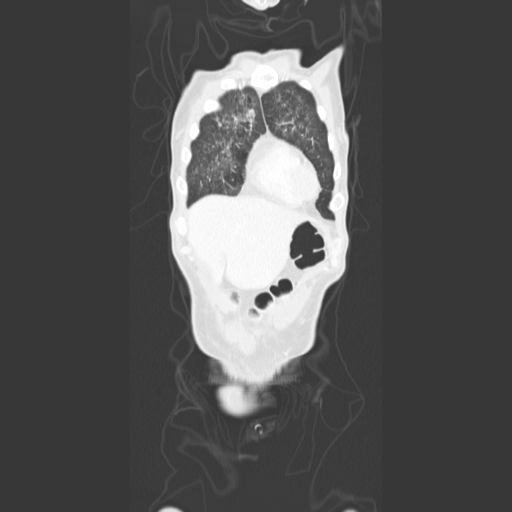
[im 45/112  lung]
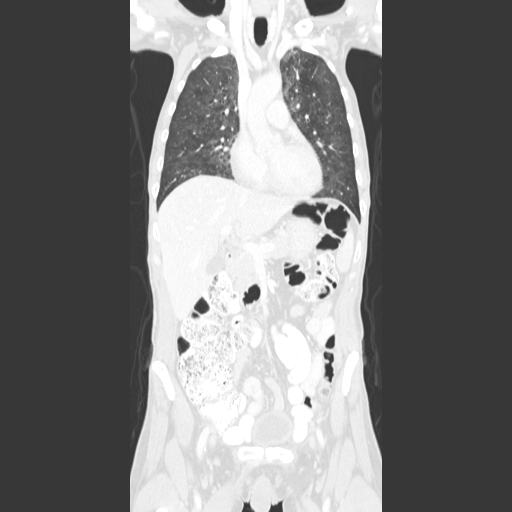
[im 67/112  lung]
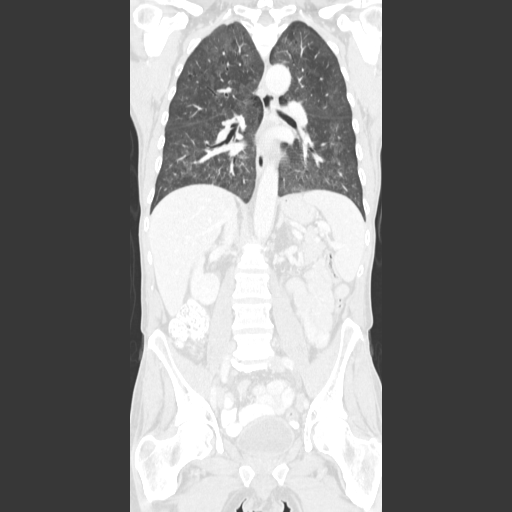

[15 of 36 positions shown; findings below may reference images not displayed]

FINDINGS: CT CHEST FINDINGS

There are ground-glass opacities throughout the lungs. These are
predominantly peribronchovascular There clustered nodular densities
posteriorly in the right upper lobe on images 25 and 26. Coarsened
linear densities and Early architectural distortion in the anterior
right upper lobe. No suspicious pulmonary nodules. No pleural
effusions.

Heart is normal size. Aorta is normal caliber. No mediastinal,
hilar, or axillary adenopathy. Chest wall soft tissues are
unremarkable.

No acute bony abnormality.

CT ABDOMEN AND PELVIS FINDINGS

Small hypodensity peripherally in the right hepatic lobe measuring 5
mm. This is too small to characterize but most likely a small cyst.
Spleen, gallbladder, pancreas, adrenals and kidneys are
unremarkable.

Urinary bladder wall appears slightly thickened diffusely. Cannot
exclude cystitis. This may also be related to mild prostate
enlargement. Moderate stool in the right colon and transverse colon.
Descending colon is decompressed. No evidence for obstructing lesion
or process. No free fluid, free air or adenopathy. Small/shotty
retroperitoneal lymph nodes. Indexed node on image 75 has a short
axis diameter of 6 mm.

Degenerative changes in the lower lumbar spine. No acute bony
abnormality.
IMPRESSION: Ground-glass peribronchovascular opacities within the lungs. There
is some architectural distortion noted in the anterior right upper
lobe. This is nonspecific, with the differential including subacute
hypersensitivity pneumonitis, atypical/ viral pneumonia, or less
likely organizing pneumonia.

Mild bladder wall thickening. While this could reflect cystitis, I
suspect is related to mild bladder outlet obstruction and enlarged
prostate.

No explanation seen to the patient's weight loss or early satiety.

## 2013-09-10 MED ORDER — IOHEXOL 300 MG/ML  SOLN
100.0000 mL | Freq: Once | INTRAMUSCULAR | Status: AC | PRN
  Administered 2013-09-10: 100 mL via INTRAVENOUS

## 2013-09-12 ENCOUNTER — Other Ambulatory Visit: Payer: Self-pay | Admitting: Family Medicine

## 2013-09-12 ENCOUNTER — Ambulatory Visit (AMBULATORY_SURGERY_CENTER): Payer: 59 | Admitting: Gastroenterology

## 2013-09-12 ENCOUNTER — Encounter: Payer: Self-pay | Admitting: Gastroenterology

## 2013-09-12 ENCOUNTER — Telehealth: Payer: Self-pay | Admitting: Family Medicine

## 2013-09-12 ENCOUNTER — Encounter: Payer: Self-pay | Admitting: Family Medicine

## 2013-09-12 VITALS — BP 138/79 | HR 82 | Temp 98.9°F | Resp 18 | Ht 69.5 in | Wt 133.0 lb

## 2013-09-12 DIAGNOSIS — R6881 Early satiety: Secondary | ICD-10-CM

## 2013-09-12 DIAGNOSIS — D649 Anemia, unspecified: Secondary | ICD-10-CM

## 2013-09-12 DIAGNOSIS — R634 Abnormal weight loss: Secondary | ICD-10-CM

## 2013-09-12 DIAGNOSIS — D1330 Benign neoplasm of unspecified part of small intestine: Secondary | ICD-10-CM

## 2013-09-12 DIAGNOSIS — K2970 Gastritis, unspecified, without bleeding: Secondary | ICD-10-CM

## 2013-09-12 DIAGNOSIS — R9389 Abnormal findings on diagnostic imaging of other specified body structures: Secondary | ICD-10-CM

## 2013-09-12 DIAGNOSIS — D131 Benign neoplasm of stomach: Secondary | ICD-10-CM

## 2013-09-12 DIAGNOSIS — K2990 Gastroduodenitis, unspecified, without bleeding: Secondary | ICD-10-CM

## 2013-09-12 MED ORDER — SODIUM CHLORIDE 0.9 % IV SOLN: 500.0000 mL | INTRAVENOUS | Status: DC

## 2013-09-12 MED ORDER — OMEPRAZOLE 40 MG PO CPDR: 40.0000 mg | DELAYED_RELEASE_CAPSULE | Freq: Every day | ORAL | Status: DC

## 2013-09-12 NOTE — Progress Notes (Signed)
Called to room to assist during endoscopic procedure.  Patient ID and intended procedure confirmed with present staff. Received instructions for my participation in the procedure from the performing physician.  

## 2013-09-12 NOTE — Patient Instructions (Signed)

## 2013-09-12 NOTE — Op Note (Addendum)
[place]  [address] [place], [address]   ENDOSCOPY PROCEDURE REPORT  PATIENT: Jeffery Franklin, Jeffery Franklin  MR#: [id number] BIRTHDATE: 1950/11/20 , 2  yrs. old GENDER: Male ENDOSCOPIST: Ladene Artist, MD, [facility] PROCEDURE DATE:  09/12/2013 PROCEDURE:  EGD w/ biopsy ASA CLASS:     Class II INDICATIONS:  Weight loss.   Anemia.   early satiety. MEDICATIONS: MAC sedation, administered by CRNA and propofol (Diprivan) 200mg  IV TOPICAL ANESTHETIC: Cetacaine Spray DESCRIPTION OF PROCEDURE: After the risks benefits and alternatives of the procedure were thoroughly explained, informed consent was obtained.  The LB [id number] endoscope was introduced through the mouth and advanced to the second portion of the duodenum. Without limitations.  The instrument was slowly withdrawn as the mucosa was fully examined.  STOMACH: Gastritis, focal, was found on the posterior wall of the gastric body.  Multiple biopsies were performed.   The stomach otherwise appeared normal. DUODENUM: The duodenal mucosa showed no abnormalities in the bulb and second portion of the duodenum.  Cold forceps biopsies were taken in the bulb and second portion. ESOPHAGUS: There was LA Class A esophagitis noted.   The esophagus was otherwise normal.  Retroflexed views revealed a small hiatal hernia.     The scope was then withdrawn from the patient and the procedure completed.  COMPLICATIONS: There were no complications. ENDOSCOPIC IMPRESSION: 1.   Gastritis, focal, on the posterior wall of the gastric body; multiple biopsies 2.   Grade A erosive esophagitis 3.   Small hiatal hernia  RECOMMENDATIONS: 1.  Anti-reflux regimen 2.  PPI qam: omeprazole 40 mg po qam, 1 year of refills 3.  Await pathology results 4.  My office will arrange for you to have a Gastric Emptying Scan performed.  This is a radiology test that gives an idea of how well your stomach functions. 5.  No cause for wt loss, early  satiety or anemia found  eSigned:  Ladene Artist, MD, [facility] 09/12/2013 8:49 AM Revised: 09/12/2013 8:49 AM  WN:IOEV Redmond School, MD

## 2013-09-15 ENCOUNTER — Telehealth: Payer: Self-pay | Admitting: *Deleted

## 2013-09-15 ENCOUNTER — Telehealth: Payer: Self-pay | Admitting: Family Medicine

## 2013-09-15 ENCOUNTER — Telehealth: Payer: Self-pay

## 2013-09-15 DIAGNOSIS — R634 Abnormal weight loss: Secondary | ICD-10-CM

## 2013-09-15 DIAGNOSIS — R6881 Early satiety: Secondary | ICD-10-CM

## 2013-09-15 NOTE — Telephone Encounter (Signed)
Go ahead and make the appointment

## 2013-09-15 NOTE — Telephone Encounter (Signed)
According to procedure report 09/12/13 patient needs GES for weight loss.  Patient is scheduled for 09/30/13 9:30 at [facility].  Patient to be NPO after midnight and have no stomach meds 24 hours prior.  He verbalized understanding of all instructions

## 2013-09-15 NOTE — Telephone Encounter (Signed)
Phoned [facility] [phone] they are closed for snow

## 2013-09-15 NOTE — Telephone Encounter (Signed)
Gabriel Cirri please call Pulmonary next day open

## 2013-09-15 NOTE — Telephone Encounter (Signed)
  Follow up Call-  Call back number 09/12/2013 07/07/2013  Post procedure Call Back phone  # [phone] cell [phone]  Permission to leave phone message Yes Yes     Patient questions:  Do you have a fever, pain , or abdominal swelling? no Pain Score  0 *  Have you tolerated food without any problems? yes  Have you been able to return to your normal activities? yes  Do you have any questions about your discharge instructions: Diet   no Medications  no Follow up visit  no  Do you have questions or concerns about your Care? no  Actions: * If pain score is 4 or above: No action needed, pain <4.

## 2013-09-15 NOTE — Telephone Encounter (Signed)
Refer to pulmonary for cough, weight loss and abnormal ct scan

## 2013-09-15 NOTE — Telephone Encounter (Signed)
Pt called and states he talked with you and needs to go ahead with a referral for his cough.  He is going out of country on Saturday, so would love to have appt this week.  Have referring office call him at [phone] to make appt.

## 2013-09-17 ENCOUNTER — Other Ambulatory Visit (INDEPENDENT_AMBULATORY_CARE_PROVIDER_SITE_OTHER): Payer: 59

## 2013-09-17 ENCOUNTER — Telehealth: Payer: Self-pay | Admitting: Internal Medicine

## 2013-09-17 ENCOUNTER — Encounter: Payer: Self-pay | Admitting: Internal Medicine

## 2013-09-17 ENCOUNTER — Ambulatory Visit (INDEPENDENT_AMBULATORY_CARE_PROVIDER_SITE_OTHER): Payer: 59 | Admitting: Internal Medicine

## 2013-09-17 ENCOUNTER — Other Ambulatory Visit: Payer: Self-pay | Admitting: Internal Medicine

## 2013-09-17 VITALS — BP 132/80 | HR 115 | Temp 99.1°F | Ht 69.5 in | Wt 128.0 lb

## 2013-09-17 DIAGNOSIS — Z87891 Personal history of nicotine dependence: Secondary | ICD-10-CM

## 2013-09-17 DIAGNOSIS — K2970 Gastritis, unspecified, without bleeding: Secondary | ICD-10-CM

## 2013-09-17 DIAGNOSIS — B20 Human immunodeficiency virus [HIV] disease: Secondary | ICD-10-CM | POA: Insufficient documentation

## 2013-09-17 DIAGNOSIS — K2990 Gastroduodenitis, unspecified, without bleeding: Secondary | ICD-10-CM

## 2013-09-17 DIAGNOSIS — R918 Other nonspecific abnormal finding of lung field: Secondary | ICD-10-CM

## 2013-09-17 LAB — CBC WITH DIFFERENTIAL/PLATELET
Basophils Absolute: 0 10*3/uL (ref 0.0–0.1)
Basophils Relative: 0 % (ref 0.0–3.0)
EOS ABS: 0 10*3/uL (ref 0.0–0.7)
Eosinophils Relative: 0 % (ref 0.0–5.0)
HCT: 31.9 % — ABNORMAL LOW (ref 39.0–52.0)
HEMOGLOBIN: 10.4 g/dL — AB (ref 13.0–17.0)
Lymphs Abs: 0.4 10*3/uL — ABNORMAL LOW (ref 0.7–4.0)
MCHC: 32.5 g/dL (ref 30.0–36.0)
MCV: 92.7 fl (ref 78.0–100.0)
Monocytes Absolute: 0.6 10*3/uL (ref 0.1–1.0)
Monocytes Relative: 7.1 % (ref 3.0–12.0)
Neutro Abs: 8 10*3/uL — ABNORMAL HIGH (ref 1.4–7.7)
Neutrophils Relative %: 88.1 % — ABNORMAL HIGH (ref 43.0–77.0)
Platelets: 378 10*3/uL (ref 150.0–400.0)
RBC: 3.44 Mil/uL — AB (ref 4.22–5.81)
RDW: 14.4 % (ref 11.5–14.6)
WBC: 9.1 10*3/uL (ref 4.5–10.5)

## 2013-09-17 LAB — SEDIMENTATION RATE: Sed Rate: 110 mm/hr — ABNORMAL HIGH (ref 0–22)

## 2013-09-17 LAB — HIV ANTIBODY (ROUTINE TESTING W REFLEX): HIV: REACTIVE

## 2013-09-17 MED ORDER — PREDNISONE 10 MG PO TABS: ORAL_TABLET | ORAL | Status: DC

## 2013-09-17 MED ORDER — SULFAMETHOXAZOLE-TMP DS 800-160 MG PO TABS: ORAL_TABLET | ORAL | Status: DC

## 2013-09-17 MED ORDER — OMEPRAZOLE 40 MG PO CPDR: DELAYED_RELEASE_CAPSULE | ORAL | Status: DC

## 2013-09-17 NOTE — Assessment & Plan Note (Signed)
ddx is HIV related infectious process like PCP early stage or HSP/ Boop but if HIV neg probably will need FOB to establish a tissue dx  Unfortunately already has plans for trip to [place] starting 2/21 and "I'm going even if the bury me there" so not much time to w/u this problem and not enthusiastic about his traveling s a specific dx though acute bacterial or viral pna seems extremely unlikely and nothing to offer empirically (except perhaps bactrim) in interim.  See instructions for specific recommendations which were reviewed directly with the patient who was given a copy with highlighter outlining the key components.

## 2013-09-17 NOTE — Progress Notes (Signed)
   Subjective:    Patient ID: Jeffery Franklin, male    DOB: 1950/08/07  MRN: [id number]  HPI  63 yowm quit smoking 06/2013 with indoloent onset progressive doe/ cough/ wt loss dating back to Nov 2014    09/17/2013 1st [facility] office visit/ Wert  Chief Complaint  Patient presents with  . Pulmonary Consult    Self referred. Pt c/o cough, SOB and unintended wt loss for the past 4-6 wks. Cough is prod with minimal thick, tan colored sputum.  He states that he notices SOB after coughing spells. He feels like he is unable to take in a good deep breath.   Last HIV check 5 y prior to OV  And states mutually monogamous since then.   No better with tessilon or saba to date.  No obvious day to day or daytime variabilty or assoc  cp or chest tightness, subjective wheeze overt sinus   symptoms. No unusual exp hx or h/o childhood pna/ asthma or knowledge of premature birth.  Sleeping ok without nocturnal  or early am exacerbation  of respiratory  c/o's or need for noct saba. Also denies any obvious fluctuation of symptoms with weather or environmental changes or other aggravating or alleviating factors except as outlined above   Current Medications, Allergies, Complete Past Medical History, Past Surgical History, Family History, and Social History were reviewed in [employer] record.           Review of Systems  Constitutional: Positive for appetite change and unexpected weight change. Negative for fever, chills and activity change.  HENT: Positive for trouble swallowing. Negative for congestion, dental problem, postnasal drip, rhinorrhea, sneezing, sore throat and voice change.   Eyes: Negative for visual disturbance.  Respiratory: Positive for cough and shortness of breath. Negative for choking.   Cardiovascular: Negative for chest pain and leg swelling.  Gastrointestinal: Negative for nausea, vomiting and abdominal pain.  Genitourinary: Negative for difficulty  urinating.  Musculoskeletal: Negative for arthralgias.  Skin: Negative for rash.  Psychiatric/Behavioral: Negative for behavioral problems and confusion.       Objective:   Physical Exam   amb thin wm nad   Wt Readings from Last 3 Encounters:  09/17/13 128 lb (58.06 kg)  09/12/13 133 lb (60.328 kg)  09/04/13 133 lb 9.6 oz (60.601 kg)       HEENT: nl dentition, turbinates, and orophanx. Nl external ear canals without cough reflex   NECK :  without JVD/Nodes/TM/ nl carotid upstrokes bilaterally   LUNGS: no acc muscle use, clear to A and P bilaterally without cough on insp or exp maneuvers   CV:  RRR  no s3 or murmur or increase in P2, no edema   ABD:  soft and nontender with nl excursion in the supine position. No bruits or organomegaly, bowel sounds nl  MS:  warm without deformities, calf tenderness, cyanosis or clubbing  SKIN: warm and dry without lesions    NEURO:  alert, approp, no deficits     CT chest  09/10/13  Ground-glass peribronchovascular opacities within the lungs. There  is some architectural distortion noted in the anterior right upper  lobe. This is nonspecific, with the differential including subacute  hypersensitivity pneumonitis, atypical/ viral pneumonia, or less  likely organizing pneumonia.       Assessment & Plan:

## 2013-09-17 NOTE — Patient Instructions (Addendum)
For cough try delsym  Prilosec 40 mg Take 30- 60 min before your first and last meals of the day as long as coughing   Please remember to go to the lab department downstairs for your tests - we will call you with the results when they are available with the next step

## 2013-09-17 NOTE — Telephone Encounter (Signed)
Pt is seeing Dr. Melvyn Novas right now as i was on the phone trying to schedule him an appt

## 2013-09-17 NOTE — Assessment & Plan Note (Signed)
No evidence of sign copd at eval 09/17/2013

## 2013-09-17 NOTE — Telephone Encounter (Signed)
Informed of preliminary finding of Pos HIV rec Bactrim DS 2 qid/Prednisone 10 mg take  4 each am x 2 days,   2 each am x 2 days,  1 each am x 2 days and stop And f/u ID ASAP

## 2013-09-18 ENCOUNTER — Ambulatory Visit (INDEPENDENT_AMBULATORY_CARE_PROVIDER_SITE_OTHER): Payer: 59 | Admitting: Family Medicine

## 2013-09-18 ENCOUNTER — Encounter: Payer: Self-pay | Admitting: Family Medicine

## 2013-09-18 ENCOUNTER — Other Ambulatory Visit: Payer: Self-pay | Admitting: Internal Medicine

## 2013-09-18 ENCOUNTER — Encounter: Payer: Self-pay | Admitting: Gastroenterology

## 2013-09-18 VITALS — Wt 127.0 lb

## 2013-09-18 DIAGNOSIS — Z21 Asymptomatic human immunodeficiency virus [HIV] infection status: Secondary | ICD-10-CM

## 2013-09-18 DIAGNOSIS — B20 Human immunodeficiency virus [HIV] disease: Secondary | ICD-10-CM

## 2013-09-18 LAB — RPR

## 2013-09-18 LAB — HEPATITIS C ANTIBODY: HCV Ab: NEGATIVE

## 2013-09-18 LAB — HEPATITIS A ANTIBODY, TOTAL: HEP A TOTAL AB: NONREACTIVE

## 2013-09-18 LAB — HEPATITIS B SURFACE ANTIBODY, QUANTITATIVE: Hepatitis B-Post: 164 m[IU]/mL

## 2013-09-18 NOTE — Progress Notes (Signed)
   Subjective:    Patient ID: Jeffery Franklin, male    DOB: 02/07/1951, 62 y.o.   MRN: [id number]  HPI He is here for a recheck. He recently was seen by Dr. Melvyn Novas and subsequently found to be HIV positive. His CT scan did show evidence of possible PCP. A prescription was written for Septra as well as steroids. Case was discussed with Dr. Megan Salon. He complains of a two-month history of chronic cough but no fever, chills, shortness of breath. He has had continued difficulty with fatigue and weight loss. So far his GI evaluation has been negative. Slope notices last HIV test was 18 years ago and he states he has been monogamous since then.   Review of Systems     Objective:   Physical Exam Alert and in no distress. Pulse ox after 2 trips in the hallway in my office was 82.       Assessment & Plan:  Human immunodeficiency virus (HIV) disease - Plan: RPR, GC/chlamydia probe amp, urine, Hepatitis B surface antibody, Hepatitis A Antibody, Total, Hepatitis C Antibody, HIV-1 genotypr plus, T-helper cells (CD4) count, CANCELED: T-helper cells (CD4) count  I encouraged him to start taking the antibiotic and the steroids. Discussed further treatment with him based on the blood results. Briefly discussed the possibility of his lack of having symptoms could be due to inability to mount an immune response and that he could get sicker before he gets better. He is getting ready to go on a trip and will start his medications. Encouraged him to keep in touch with me if he has any questions concerning this, to call me. Also instructed him to call me if he has a productive cough to do further testing. I will then order PCP antigen.

## 2013-09-18 NOTE — Telephone Encounter (Signed)
lm

## 2013-09-19 LAB — T-HELPER CELLS (CD4) COUNT (NOT AT ARMC)
ABSOLUTE CD4: 97 /uL — AB (ref 381–1469)
CD4 T Helper %: 10 % — ABNORMAL LOW (ref 32–62)
TOTAL LYMPHOCYTE: 12 % (ref 12–46)
Total lymphocyte count: 972 /uL (ref 700–3300)
WBC, lymph enumeration: 8.1 10*3/uL (ref 4.0–10.5)

## 2013-09-19 LAB — HIV 1/2 CONFIRMATION
HIV-1 antibody: POSITIVE — AB
HIV-2 Ab: NEGATIVE

## 2013-09-19 LAB — GC/CHLAMYDIA PROBE AMP, URINE

## 2013-09-26 LAB — HIV-1 GENOTYPR PLUS

## 2013-09-29 ENCOUNTER — Encounter: Payer: Self-pay | Admitting: Family Medicine

## 2013-09-29 ENCOUNTER — Other Ambulatory Visit: Payer: Self-pay | Admitting: Family Medicine

## 2013-09-29 ENCOUNTER — Ambulatory Visit (INDEPENDENT_AMBULATORY_CARE_PROVIDER_SITE_OTHER): Payer: 59 | Admitting: Family Medicine

## 2013-09-29 VITALS — BP 110/70 | HR 68 | Wt 123.0 lb

## 2013-09-29 DIAGNOSIS — B59 Pneumocystosis: Secondary | ICD-10-CM

## 2013-09-29 DIAGNOSIS — Z21 Asymptomatic human immunodeficiency virus [HIV] infection status: Secondary | ICD-10-CM

## 2013-09-29 DIAGNOSIS — B20 Human immunodeficiency virus [HIV] disease: Secondary | ICD-10-CM

## 2013-09-29 DIAGNOSIS — F1610 Hallucinogen abuse, uncomplicated: Secondary | ICD-10-CM

## 2013-09-29 DIAGNOSIS — R918 Other nonspecific abnormal finding of lung field: Secondary | ICD-10-CM

## 2013-09-29 LAB — CBC WITH DIFFERENTIAL/PLATELET
BASOS ABS: 0 10*3/uL (ref 0.0–0.1)
Basophils Relative: 0 % (ref 0–1)
EOS ABS: 0 10*3/uL (ref 0.0–0.7)
Eosinophils Relative: 0 % (ref 0–5)
HCT: 28.2 % — ABNORMAL LOW (ref 39.0–52.0)
HEMOGLOBIN: 9.8 g/dL — AB (ref 13.0–17.0)
Lymphocytes Relative: 15 % (ref 12–46)
Lymphs Abs: 0.6 10*3/uL — ABNORMAL LOW (ref 0.7–4.0)
MCH: 29.5 pg (ref 26.0–34.0)
MCHC: 34.8 g/dL (ref 30.0–36.0)
MCV: 84.9 fL (ref 78.0–100.0)
Monocytes Absolute: 0.3 10*3/uL (ref 0.1–1.0)
Monocytes Relative: 7 % (ref 3–12)
NEUTROS ABS: 2.9 10*3/uL (ref 1.7–7.7)
Neutrophils Relative %: 78 % — ABNORMAL HIGH (ref 43–77)
PLATELETS: 186 10*3/uL (ref 150–400)
RBC: 3.32 MIL/uL — ABNORMAL LOW (ref 4.22–5.81)
RDW: 15.1 % (ref 11.5–15.5)
WBC: 3.7 10*3/uL — ABNORMAL LOW (ref 4.0–10.5)

## 2013-09-29 MED ORDER — ELVITEG-COBIC-EMTRICIT-TENOFDF 150-150-200-300 MG PO TABS: 1.0000 | ORAL_TABLET | Freq: Every day | ORAL | Status: DC

## 2013-09-29 MED ORDER — SULFAMETHOXAZOLE-TMP DS 800-160 MG PO TABS: ORAL_TABLET | ORAL | Status: DC

## 2013-09-29 NOTE — Patient Instructions (Signed)
Stay on the antibiotic for another 9 days and then you can back down to one pill a day.

## 2013-09-29 NOTE — Progress Notes (Signed)
   Subjective:    Patient ID: Jeffery Franklin, male    DOB: 12/27/1950, 63 y.o.   MRN: [id number]  HPI He is here for a recheck. His cough and congestion have improved. He did have a few episodes of dizziness and in fact fell one time. They're intermittent in nature and today he has had no symptoms. He does complain of an intermittent tingling sensation in his toes and intermittently in his fingers. He has also had a couple of episodes of diarrhea. He did have one episode where he did soil himself due to this. He did use Imodium. I did meet with him and his partner last night to discuss the overall situation. Answered all questions. His partner will get tested. Also discussed starting HIV medications.  Review of Systems     Objective:   Physical Exam Alert and in no distress otherwise not examined       Assessment & Plan:  Human immunodeficiency virus (HIV) disease - Plan: CBC with Differential, Comprehensive metabolic panel, HIV 1 RNA quant-no reflex-bld, elvitegravir-cobicistat-emtricitabine-tenofovir (STRIBILD) 150-150-200-300 MG TABS tablet  PCP (pneumocystis carinii pneumonia) - Plan: CBC with Differential, Comprehensive metabolic panel, HIV 1 RNA quant-no reflex-bld, sulfamethoxazole-trimethoprim (BACTRIM DS) 800-160 MG per tablet  HIV positive  Pulmonary infiltrates assoc with HIV Pos status (new dx 08/17/13) - Plan: sulfamethoxazole-trimethoprim (BACTRIM DS) 800-160 MG per tablet, elvitegravir-cobicistat-emtricitabine-tenofovir (STRIBILD) 150-150-200-300 MG TABS tablet  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 Discussed various HIV medications and we have decided to start his Triavil. This will probably be easier in the long run to take due to less constraints. Discussed possible side effects. He is to continue on his antibiotics for total of 3 weeks. I will recheck his blood work in one month after he has been on his HIV medications. Also discussed the  need for him to stay on Septra even after he finishes the present dosing. He is to take it once per day after that. I also discussed the fact that I am going to take a watchful waiting approach to some of his other symptoms to see if they improve when his HIV is under better control. If they do not, then we will pursue the dizziness and the bladder/bowel issues.

## 2013-09-30 ENCOUNTER — Encounter (HOSPITAL_COMMUNITY): Admission: RE | Admit: 2013-09-30 | Payer: 59 | Source: Ambulatory Visit

## 2013-09-30 ENCOUNTER — Emergency Department (HOSPITAL_COMMUNITY): Payer: 59

## 2013-09-30 ENCOUNTER — Encounter (HOSPITAL_COMMUNITY): Payer: Self-pay | Admitting: Emergency Medicine

## 2013-09-30 ENCOUNTER — Inpatient Hospital Stay (HOSPITAL_COMMUNITY)
Admission: EM | Admit: 2013-09-30 | Discharge: 2013-10-02 | DRG: 640 | Disposition: A | Discharge status: Home / Self Care | Payer: 59 | Attending: Internal Medicine | Admitting: Internal Medicine

## 2013-09-30 DIAGNOSIS — D61818 Other pancytopenia: Secondary | ICD-10-CM

## 2013-09-30 DIAGNOSIS — Z833 Family history of diabetes mellitus: Secondary | ICD-10-CM

## 2013-09-30 DIAGNOSIS — R21 Rash and other nonspecific skin eruption: Secondary | ICD-10-CM | POA: Diagnosis present

## 2013-09-30 DIAGNOSIS — R636 Underweight: Secondary | ICD-10-CM | POA: Diagnosis present

## 2013-09-30 DIAGNOSIS — Z8249 Family history of ischemic heart disease and other diseases of the circulatory system: Secondary | ICD-10-CM

## 2013-09-30 DIAGNOSIS — R7402 Elevation of levels of lactic acid dehydrogenase (LDH): Secondary | ICD-10-CM | POA: Diagnosis present

## 2013-09-30 DIAGNOSIS — R7401 Elevation of levels of liver transaminase levels: Secondary | ICD-10-CM | POA: Diagnosis present

## 2013-09-30 DIAGNOSIS — I951 Orthostatic hypotension: Secondary | ICD-10-CM

## 2013-09-30 DIAGNOSIS — I10 Essential (primary) hypertension: Secondary | ICD-10-CM

## 2013-09-30 DIAGNOSIS — R740 Nonspecific elevation of levels of transaminase and lactic acid dehydrogenase [LDH]: Secondary | ICD-10-CM

## 2013-09-30 DIAGNOSIS — B20 Human immunodeficiency virus [HIV] disease: Secondary | ICD-10-CM

## 2013-09-30 DIAGNOSIS — E875 Hyperkalemia: Secondary | ICD-10-CM

## 2013-09-30 DIAGNOSIS — R6881 Early satiety: Secondary | ICD-10-CM

## 2013-09-30 DIAGNOSIS — D649 Anemia, unspecified: Secondary | ICD-10-CM | POA: Diagnosis present

## 2013-09-30 DIAGNOSIS — Z8042 Family history of malignant neoplasm of prostate: Secondary | ICD-10-CM

## 2013-09-30 DIAGNOSIS — Z21 Asymptomatic human immunodeficiency virus [HIV] infection status: Secondary | ICD-10-CM

## 2013-09-30 DIAGNOSIS — Z681 Body mass index (BMI) 19 or less, adult: Secondary | ICD-10-CM

## 2013-09-30 DIAGNOSIS — E43 Unspecified severe protein-calorie malnutrition: Secondary | ICD-10-CM | POA: Diagnosis present

## 2013-09-30 DIAGNOSIS — M899 Disorder of bone, unspecified: Secondary | ICD-10-CM | POA: Diagnosis present

## 2013-09-30 DIAGNOSIS — Z85828 Personal history of other malignant neoplasm of skin: Secondary | ICD-10-CM

## 2013-09-30 DIAGNOSIS — M949 Disorder of cartilage, unspecified: Secondary | ICD-10-CM

## 2013-09-30 DIAGNOSIS — Z87891 Personal history of nicotine dependence: Secondary | ICD-10-CM

## 2013-09-30 DIAGNOSIS — E785 Hyperlipidemia, unspecified: Secondary | ICD-10-CM | POA: Diagnosis present

## 2013-09-30 DIAGNOSIS — M810 Age-related osteoporosis without current pathological fracture: Secondary | ICD-10-CM | POA: Diagnosis present

## 2013-09-30 DIAGNOSIS — K4090 Unilateral inguinal hernia, without obstruction or gangrene, not specified as recurrent: Secondary | ICD-10-CM

## 2013-09-30 DIAGNOSIS — R918 Other nonspecific abnormal finding of lung field: Secondary | ICD-10-CM

## 2013-09-30 DIAGNOSIS — E871 Hypo-osmolality and hyponatremia: Principal | ICD-10-CM

## 2013-09-30 DIAGNOSIS — R64 Cachexia: Secondary | ICD-10-CM | POA: Diagnosis present

## 2013-09-30 DIAGNOSIS — R634 Abnormal weight loss: Secondary | ICD-10-CM

## 2013-09-30 HISTORY — DX: Human immunodeficiency virus (HIV) disease: B20

## 2013-09-30 HISTORY — DX: Asymptomatic human immunodeficiency virus (hiv) infection status: Z21

## 2013-09-30 LAB — CBC WITH DIFFERENTIAL/PLATELET
BASOS ABS: 0 10*3/uL (ref 0.0–0.1)
BASOS PCT: 0 % (ref 0–1)
Eosinophils Absolute: 0 10*3/uL (ref 0.0–0.7)
Eosinophils Relative: 0 % (ref 0–5)
HCT: 27.1 % — ABNORMAL LOW (ref 39.0–52.0)
Hemoglobin: 9.6 g/dL — ABNORMAL LOW (ref 13.0–17.0)
Lymphocytes Relative: 12 % (ref 12–46)
Lymphs Abs: 0.3 10*3/uL — ABNORMAL LOW (ref 0.7–4.0)
MCH: 29.8 pg (ref 26.0–34.0)
MCHC: 35.4 g/dL (ref 30.0–36.0)
MCV: 84.2 fL (ref 78.0–100.0)
Monocytes Absolute: 0.2 10*3/uL (ref 0.1–1.0)
Monocytes Relative: 6 % (ref 3–12)
NEUTROS ABS: 2.3 10*3/uL (ref 1.7–7.7)
NEUTROS PCT: 82 % — AB (ref 43–77)
PLATELETS: 131 10*3/uL — AB (ref 150–400)
RBC: 3.22 MIL/uL — ABNORMAL LOW (ref 4.22–5.81)
RDW: 14.2 % (ref 11.5–15.5)
WBC: 2.8 10*3/uL — ABNORMAL LOW (ref 4.0–10.5)

## 2013-09-30 LAB — COMPREHENSIVE METABOLIC PANEL
ALBUMIN: 2.9 g/dL — AB (ref 3.5–5.2)
ALK PHOS: 90 U/L (ref 39–117)
ALT: 95 U/L — ABNORMAL HIGH (ref 0–53)
ALT: 119 U/L — ABNORMAL HIGH (ref 0–53)
AST: 79 U/L — AB (ref 0–37)
AST: 106 U/L — ABNORMAL HIGH (ref 0–37)
Albumin: 2.3 g/dL — ABNORMAL LOW (ref 3.5–5.2)
Alkaline Phosphatase: 90 U/L (ref 39–117)
BILIRUBIN TOTAL: 0.3 mg/dL (ref 0.2–1.2)
BUN: 16 mg/dL (ref 6–23)
BUN: 13 mg/dL (ref 6–23)
CHLORIDE: 90 meq/L — AB (ref 96–112)
CO2: 23 mEq/L (ref 19–32)
CO2: 21 mEq/L (ref 19–32)
Calcium: 8.2 mg/dL — ABNORMAL LOW (ref 8.4–10.5)
Calcium: 7.7 mg/dL — ABNORMAL LOW (ref 8.4–10.5)
Chloride: 88 mEq/L — ABNORMAL LOW (ref 96–112)
Creat: 0.73 mg/dL (ref 0.50–1.35)
Creatinine, Ser: 0.74 mg/dL (ref 0.50–1.35)
GFR calc non Af Amer: >90 mL/min (ref >90)
GLUCOSE: 126 mg/dL — AB (ref 70–99)
Glucose, Bld: 100 mg/dL — ABNORMAL HIGH (ref 70–99)
POTASSIUM: 4.9 meq/L (ref 3.5–5.3)
POTASSIUM: 4.1 meq/L (ref 3.7–5.3)
Sodium: 118 mEq/L — ABNORMAL LOW (ref 135–145)
Sodium: 122 mEq/L — ABNORMAL LOW (ref 137–147)
TOTAL PROTEIN: 6.6 g/dL (ref 6.0–8.3)
TOTAL PROTEIN: 6.4 g/dL (ref 6.0–8.3)
Total Bilirubin: 0.2 mg/dL — ABNORMAL LOW (ref 0.3–1.2)

## 2013-09-30 LAB — BASIC METABOLIC PANEL
BUN: 17 mg/dL (ref 6–23)
CHLORIDE: 85 meq/L — AB (ref 96–112)
CO2: 21 mEq/L (ref 19–32)
Calcium: 8.1 mg/dL — ABNORMAL LOW (ref 8.4–10.5)
Creatinine, Ser: 0.83 mg/dL (ref 0.50–1.35)
GFR calc Af Amer: >90 mL/min (ref >90)
Glucose, Bld: 107 mg/dL — ABNORMAL HIGH (ref 70–99)
POTASSIUM: 5.4 meq/L — AB (ref 3.7–5.3)
Sodium: 118 mEq/L — CL (ref 137–147)

## 2013-09-30 LAB — URINALYSIS, ROUTINE W REFLEX MICROSCOPIC
BILIRUBIN URINE: NEGATIVE
Glucose, UA: NEGATIVE mg/dL
HGB URINE DIPSTICK: NEGATIVE
Ketones, ur: NEGATIVE mg/dL
Leukocytes, UA: NEGATIVE
NITRITE: NEGATIVE
Protein, ur: NEGATIVE mg/dL
SPECIFIC GRAVITY, URINE: 1.012 (ref 1.005–1.030)
Urobilinogen, UA: 0.2 mg/dL (ref 0.0–1.0)
pH: 6.5 (ref 5.0–8.0)

## 2013-09-30 LAB — CREATININE, URINE, RANDOM: CREATININE, URINE: 34.05 mg/dL

## 2013-09-30 LAB — OSMOLALITY, URINE: OSMOLALITY UR: 265 mosm/kg — AB (ref 390–1090)

## 2013-09-30 LAB — SODIUM, URINE, RANDOM: Sodium, Ur: 22 mEq/L

## 2013-09-30 LAB — MAGNESIUM: MAGNESIUM: 1.7 mg/dL (ref 1.5–2.5)

## 2013-09-30 LAB — MRSA PCR SCREENING: MRSA by PCR: NEGATIVE

## 2013-09-30 LAB — OSMOLALITY: Osmolality: 250 mOsm/kg — ABNORMAL LOW (ref 275–300)

## 2013-09-30 MED ORDER — OXYCODONE HCL 5 MG PO TABS: 5.0000 mg | ORAL_TABLET | ORAL | Status: DC | PRN

## 2013-09-30 MED ORDER — PANTOPRAZOLE SODIUM 40 MG PO TBEC
80.0000 mg | DELAYED_RELEASE_TABLET | Freq: Two times a day (BID) | ORAL | Status: DC
  Administered 2013-09-30 – 2013-10-02 (×4): 80 mg via ORAL
  Filled 2013-09-30 (×4): qty 2

## 2013-09-30 MED ORDER — ACETAMINOPHEN 650 MG RE SUPP: 650.0000 mg | Freq: Four times a day (QID) | RECTAL | Status: DC | PRN

## 2013-09-30 MED ORDER — ONDANSETRON HCL 4 MG PO TABS: 4.0000 mg | ORAL_TABLET | Freq: Four times a day (QID) | ORAL | Status: DC | PRN

## 2013-09-30 MED ORDER — ALUM & MAG HYDROXIDE-SIMETH 200-200-20 MG/5ML PO SUSP: 30.0000 mL | Freq: Four times a day (QID) | ORAL | Status: DC | PRN

## 2013-09-30 MED ORDER — ALBUTEROL SULFATE (2.5 MG/3ML) 0.083% IN NEBU: 2.5000 mg | INHALATION_SOLUTION | RESPIRATORY_TRACT | Status: DC | PRN

## 2013-09-30 MED ORDER — SODIUM CHLORIDE 0.9 % IV BOLUS (SEPSIS)
1000.0000 mL | Freq: Once | INTRAVENOUS | Status: AC
  Administered 2013-09-30: 1000 mL via INTRAVENOUS

## 2013-09-30 MED ORDER — IPRATROPIUM BROMIDE 0.02 % IN SOLN: 0.5000 mg | RESPIRATORY_TRACT | Status: DC | PRN

## 2013-09-30 MED ORDER — SODIUM CHLORIDE 0.9 % IV SOLN
INTRAVENOUS | Status: DC
  Administered 2013-09-30: 1000 mL via INTRAVENOUS
  Administered 2013-10-01: 110 mL/h via INTRAVENOUS
  Administered 2013-10-01: 1000 mL via INTRAVENOUS

## 2013-09-30 MED ORDER — POLYETHYLENE GLYCOL 3350 17 G PO PACK
17.0000 g | PACK | Freq: Every day | ORAL | Status: DC | PRN
  Filled 2013-09-30: qty 1

## 2013-09-30 MED ORDER — ACETAMINOPHEN 325 MG PO TABS: 650.0000 mg | ORAL_TABLET | Freq: Four times a day (QID) | ORAL | Status: DC | PRN

## 2013-09-30 MED ORDER — MAGNESIUM CITRATE PO SOLN
1.0000 | Freq: Once | ORAL | Status: AC | PRN
  Filled 2013-09-30: qty 296

## 2013-09-30 MED ORDER — ONDANSETRON HCL 4 MG/2ML IJ SOLN: 4.0000 mg | Freq: Four times a day (QID) | INTRAMUSCULAR | Status: DC | PRN

## 2013-09-30 MED ORDER — SORBITOL 70 % SOLN
30.0000 mL | Freq: Every day | Status: DC | PRN
  Filled 2013-09-30: qty 30

## 2013-09-30 MED ORDER — SODIUM CHLORIDE 0.9 % IJ SOLN
3.0000 mL | Freq: Two times a day (BID) | INTRAMUSCULAR | Status: DC
  Administered 2013-09-30: 3 mL via INTRAVENOUS

## 2013-09-30 MED ORDER — WHITE PETROLATUM GEL
Status: AC
  Filled 2013-09-30: qty 5

## 2013-09-30 NOTE — Consult Note (Addendum)
    [name]     Reason for Consult: HIV/AIDS    Referring Physician: Dr. Grandville Silos  Principal Problem:   Hyponatremia Active Problems:   HTN (hypertension)   Anemia   HIV positive   Orthostasis: by pulse   Hyperkalemia   Pancytopenia   Acute hyponatremia   . pantoprazole  80 mg Oral BID AC  . sodium chloride  3 mL Intravenous Q12H  . white petrolatum        Recommendations: Will hold Stribild pending normalization of sodium He can get his prescription filled at [facility] on [address] at discharge to avoid any delay He needs to be on Bactrim DS 1 tab daily for PCP prophylaxis   Assessment: He has AIds with CD4 of just 97 and needs to get on treatment ASAP.  He has other ways to get meds though hopefully should be able to get it from his pharmacy at discharge.  He was treated recently for ?PCP but just for 5 days, though seems to be stable now.  There is currently no hypoxia or SOB and CXR is improved so will continue with pcp prophylaxis.    Antibiotics: none  HPI: Jeffery Franklin is a 63 y.o. male with HIV/AIDS diagnosed 2 weeks ago and was to start [place] but unable to get it from his pharmacy yet.  He has a CD4 of 97 and wild type genotype.  Hep A non immune.  Possible recent PCP pneumonia, certainly clinically c/w with ground glass opacities.  He was to start PCP treatment as well, though his prescription states 4 times per day.  Also with GI issues likely related to his HIV with weight loss.     Review of Systems: A comprehensive review of systems was negative.  Past Medical History  Diagnosis Date  . Osteoporosis     osteopenia in past  . Hypertension   . Hyperlipidemia   . Basal cell carcinoma     right temple/ 2013  . Anemia   . HIV (human immunodeficiency virus infection)     History  Substance Use Topics  . Smoking status: Former Smoker -- 0.50 packs/day for 20 years    Types: Cigarettes    Quit date: 07/27/2013  .  Smokeless tobacco: Never Used  . Alcohol Use: 8.4 oz/week    14 Glasses of wine per week     Comment: 1-2 glasses of red wine daily    Family History  Problem Relation Age of Onset  . Hypertension Brother   . Heart disease Mother   . Heart disease Father   . Diabetes Father   . Prostate cancer Father   . Diabetes Sister   . Heart disease Brother   . Prostate cancer Brother   . Colon cancer Neg Hx   . Esophageal cancer Neg Hx   . Rectal cancer Neg Hx   . Stomach cancer Neg Hx    No Known Allergies  OBJECTIVE: Blood pressure 136/75, pulse 81, temperature 98.2 F (36.8 C), temperature source Oral, resp. rate 20, SpO2 100.00%. General: Awake, alert, nad Skin: blanching rash on chest Lungs: CTA B Cor: RRR without m Abdomen: soft, nt, nd, +bs Ext: no edema  Microbiology: No results found for this or any previous visit (from the past 240 hour(s)).  Scharlene Gloss, [facility] [place] [email or web address] [phone] pager  [phone] cell 09/30/2013, 4:38 PM

## 2013-09-30 NOTE — ED Provider Notes (Signed)
CSN: [id number]     Arrival date & time 09/30/13  1041 History   First MD Initiated Contact with Patient 09/30/13 1143     No chief complaint on file.    (Consider location/radiation/quality/duration/timing/severity/associated sxs/prior Treatment) HPI  Patient to the ER with complaints of low sodium. His PCP was doing  Follow-up blood work done and showed his sodium to be 118 therefore his PCP called and advised him to come to the ER. He has been asymptomatic and has no complaints. He was recenetly diagnosed with HIV 2 weeks ago and having 5 months of weight loss. He says he has been eating and drinking multiple times a day despite his early satiety. He is also drinking boost and denies having urinary frequency. He is afebrile, no headaches or nausea, no weakness, confusion. Vital signs are stable.  Past Medical History  Diagnosis Date  . Osteoporosis     osteopenia in past  . Hypertension   . Hyperlipidemia   . Basal cell carcinoma     right temple/ 2013  . Anemia   . HIV (human immunodeficiency virus infection)    Past Surgical History  Procedure Laterality Date  . Colonoscopy    . Skin cancer removed      right temple   Family History  Problem Relation Age of Onset  . Hypertension Brother   . Heart disease Mother   . Heart disease Father   . Diabetes Father   . Prostate cancer Father   . Diabetes Sister   . Heart disease Brother   . Prostate cancer Brother   . Colon cancer Neg Hx   . Esophageal cancer Neg Hx   . Rectal cancer Neg Hx   . Stomach cancer Neg Hx    History  Substance Use Topics  . Smoking status: Former Smoker -- 0.50 packs/day for 20 years    Types: Cigarettes    Quit date: 07/27/2013  . Smokeless tobacco: Never Used  . Alcohol Use: 8.4 oz/week    14 Glasses of wine per week     Comment: 1-2 glasses of red wine daily    Review of Systems  The patient denies anorexia, fever, weight loss,, vision loss, decreased hearing, hoarseness, chest pain,  syncope, dyspnea on exertion, peripheral edema, balance deficits, hemoptysis, abdominal pain, melena, hematochezia, severe indigestion/heartburn, hematuria, incontinence, genital sores, muscle weakness, suspicious skin lesions, transient blindness, difficulty walking, depression, unusual weight change, abnormal bleeding, enlarged lymph nodes, angioedema, and breast masses.   Allergies  Review of patient's allergies indicates no known allergies.  Home Medications   Current Outpatient Rx  Name  Route  Sig  Dispense  Refill  . B Complex-C (B-COMPLEX WITH VITAMIN C) tablet   Oral   Take 1 tablet by mouth daily.         . Multiple Vitamins-Iron (MULTIVITAMIN/IRON PO)   Oral   Take 1 tablet by mouth daily.         Marland Kitchen omeprazole (PRILOSEC) 40 MG capsule   Oral   Take 40 mg by mouth 2 (two) times daily. Take 30- 60 min before your first and last meals of the day         . sulfamethoxazole-trimethoprim (BACTRIM DS) 800-160 MG per tablet   Oral   Take 2 tablets by mouth 4 (four) times daily.         Marland Kitchen telmisartan (MICARDIS) 40 MG tablet   Oral   Take 1 tablet (40 mg total) by mouth daily.  90 tablet   3   . elvitegravir-cobicistat-emtricitabine-tenofovir (STRIBILD) 150-150-200-300 MG TABS tablet   Oral   Take 1 tablet by mouth daily with breakfast.   30 tablet   5    BP 114/59  Pulse 86  Temp(Src) 98.3 F (36.8 C) (Oral)  Resp 18  SpO2 100% Physical Exam  Nursing note and vitals reviewed. Constitutional: He appears well-developed and well-nourished. No distress.  HENT:  Head: Normocephalic and atraumatic.  Eyes: Pupils are equal, round, and reactive to light.  Neck: Normal range of motion. Neck supple.  Cardiovascular: Normal rate and regular rhythm.   Pulmonary/Chest: Effort normal.  Abdominal: Soft.  Neurological: He is alert.  Skin: Skin is warm and dry.    ED Course  Procedures (including critical care time) Labs Review Labs Reviewed  CBC WITH  DIFFERENTIAL - Abnormal; Notable for the following:    WBC 2.8 (*)    RBC 3.22 (*)    Hemoglobin 9.6 (*)    HCT 27.1 (*)    Platelets 131 (*)    Neutrophils Relative % 82 (*)    Lymphs Abs 0.3 (*)    All other components within normal limits  BASIC METABOLIC PANEL - Abnormal; Notable for the following:    Sodium 118 (*)    Potassium 5.4 (*)    Chloride 85 (*)    Glucose, Bld 107 (*)    Calcium 8.1 (*)    All other components within normal limits  URINE CULTURE  URINALYSIS, ROUTINE W REFLEX MICROSCOPIC  SODIUM, URINE, RANDOM  CREATININE, URINE, RANDOM  OSMOLALITY  OSMOLALITY, URINE  MAGNESIUM   Imaging Review Dg Chest 2 View  09/30/2013   CLINICAL DATA:  Hyponatremia  EXAM: CHEST  2 VIEW  COMPARISON:  Chest CT 09/10/2013  FINDINGS: COPD with hyperinflation. Patchy bibasilar airspace disease. This may have improved slightly since the CT scan. There is improved aeration in the lung apices since the prior CT. No effusion.  Heart size is normal.  No evidence heart failure.  IMPRESSION: Improvement in diffuse bilateral airspace disease suggestive of resolving pneumonitis.   Electronically Signed   By: Franchot Gallo M.D.   On: 09/30/2013 13:59     EKG Interpretation None      MDM   Final diagnoses:  Hyponatremia   1:07pm Sodium rechecked and is still 118. Discussed with hospitlist who requests chest xray, urinalysis, urine sodium, orthostatics. Will call back in 1 hour to discuss possible admission.  2:11 pm  Patient is mildly orhtostatic. Has received a liter of fluids which has improved BP. Chest xray shows an improving pneumonitis, no acute or persistent cardiopulmonary findings.  Pt continues to be asymptomatic.  Inpatient, team 10, stepdown, [facility]   Linus Mako, PA-C 09/30/13 1412

## 2013-09-30 NOTE — ED Notes (Signed)
States had labs drawn on mondaty called today and told his NA was low 118 and to come to er no other s/s no weakness no n/v/d

## 2013-09-30 NOTE — ED Notes (Signed)
No complaint of nausea, weakness, lightheadedness, dizziness while doing orthostatic v/s pt states he feels like his normal self

## 2013-09-30 NOTE — H&P (Signed)
[facility] History and Physical  Jeffery Franklin UXL:[id number] DOB: September 11, 1950 DOA: 09/30/2013  Referring physician: Dr Eulis Foster PCP: Wyatt Haste, MD   Chief Complaint: Abnormal labs sodium of 118  HPI: Jeffery Franklin is a 63 y.o. male  Recently diagnosed with HIV approximately 3 weeks ago on 09/17/2013 with absolute CD4 count of 97. Viral load is pending. Patient with history of hypertension, hyperlipidemia, history of right temple basal cell carcinoma 2013, history of anemia presented to the ED secondary to abnormal labs obtained at PCPs office with a sodium of 118. Patient denies any fevers, no chills, no nausea, no vomiting, no abdominal pain, no diarrhea, no constipation, no dysuria, no coughing, no chest pain, shortness of breath has improved, no melena, no hematemesis, no hematochezia. Patient does endorse some weakness. Patient states he's lost about 10 pounds since October of 2014. Patient said that he had some decreased oral intake however that has since been improvement. Patient states was recently treated for probable PCP pneumonia where he took Bactrim for about 4-5 days as well as prednisone. Patient states his PCP obtain some labs about a week prior to admission and was called this morning at his sodium was 118 and was subsequently told to present to the ED. In the emergency room basic metabolic profile obtained at his sodium of 118 potassium of 5.4 chloride of 85 otherwise was within normal limits. CBC had a white count of 2.8 hemoglobin 9.6 and a platelet count of 131 otherwise was within normal limits. Urinalysis done was unremarkable. Urine sodium was 22. Urine creatinine was 34.05. We were called to admit the patient for further evaluation and management.   Review of Systems:  As history of present illness otherwise negative. Constitutional:  No weight loss, night sweats, Fevers, chills, fatigue.  HEENT:  No headaches, Difficulty swallowing,Tooth/dental  problems,Sore throat,  No sneezing, itching, ear ache, nasal congestion, post nasal drip,  Cardio-vascular:  No chest pain, Orthopnea, PND, swelling in lower extremities, anasarca, dizziness, palpitations  GI:  No heartburn, indigestion, abdominal pain, nausea, vomiting, diarrhea, change in bowel habits, loss of appetite  Resp:  No shortness of breath with exertion or at rest. No excess mucus, no productive cough, No non-productive cough, No coughing up of blood.No change in color of mucus.No wheezing.No chest wall deformity  Skin:  no rash or lesions.  GU:  no dysuria, change in color of urine, no urgency or frequency. No flank pain.  Musculoskeletal:  No joint pain or swelling. No decreased range of motion. No back pain.  Psych:  No change in mood or affect. No depression or anxiety. No memory loss.   Past Medical History  Diagnosis Date  . Osteoporosis     osteopenia in past  . Hypertension   . Hyperlipidemia   . Basal cell carcinoma     right temple/ 2013  . Anemia   . HIV (human immunodeficiency virus infection)    Past Surgical History  Procedure Laterality Date  . Colonoscopy    . Skin cancer removed      right temple   Social History:  reports that he quit smoking about 2 months ago. His smoking use included Cigarettes. He has a 10 pack-year smoking history. He has never used smokeless tobacco. He reports that he drinks about 8.4 ounces of alcohol per week. He reports that he does not use illicit drugs.  No Known Allergies  Family History  Problem Relation Age of Onset  . Hypertension Brother   .  Heart disease Mother   . Heart disease Father   . Diabetes Father   . Prostate cancer Father   . Diabetes Sister   . Heart disease Brother   . Prostate cancer Brother   . Colon cancer Neg Hx   . Esophageal cancer Neg Hx   . Rectal cancer Neg Hx   . Stomach cancer Neg Hx      Prior to Admission medications   Medication Sig Start Date End Date Taking? Authorizing  Provider  B Complex-C (B-COMPLEX WITH VITAMIN C) tablet Take 1 tablet by mouth daily.   Yes Historical Provider, MD  Multiple Vitamins-Iron (MULTIVITAMIN/IRON PO) Take 1 tablet by mouth daily.   Yes Historical Provider, MD  omeprazole (PRILOSEC) 40 MG capsule Take 40 mg by mouth 2 (two) times daily. Take 30- 60 min before your first and last meals of the day 09/17/13  Yes Tanda Rockers, MD  sulfamethoxazole-trimethoprim (BACTRIM DS) 800-160 MG per tablet Take 2 tablets by mouth 4 (four) times daily. 09/29/13  Yes Denita Lung, MD  telmisartan (MICARDIS) 40 MG tablet Take 1 tablet (40 mg total) by mouth daily. 03/05/13  Yes Ricard Dillon, MD  elvitegravir-cobicistat-emtricitabine-tenofovir (STRIBILD) 150-150-200-300 MG TABS tablet Take 1 tablet by mouth daily with breakfast. 09/29/13   Denita Lung, MD   Physical Exam: Filed Vitals:   09/30/13 1430  BP: 118/93  Pulse: 93  Temp:   Resp: 18    BP 118/93  Pulse 93  Temp(Src) 98.3 F (36.8 C) (Oral)  Resp 18  SpO2 100%  General:  Appears calm and comfortable. In no acute cardiopulmonary distress. Eyes: PERRLA, EOMI,, normal lids, irises & conjunctiva ENT: grossly normal hearing, lips & tongue, mildly dry mucous membranes. Neck: no LAD, masses or thyromegaly Cardiovascular: RRR, no m/r/g. No LE edema. Telemetry: SR, no arrhythmias  Respiratory: CTA bilaterally, no w/r/r. Normal respiratory effort. Abdomen: soft, ntnd, positive bowel sounds, no rebound, no guarding. Skin: no rash or induration seen on limited exam Musculoskeletal: grossly normal tone BUE/BLE Psychiatric: grossly normal mood and affect, speech fluent and appropriate Neurologic: Alert and oriented x3. Cranial nerves II through XII are grossly intact. Sensation is intact. Visual fields are intact. Gait not tested secondary to safety. No focal deficits.           Labs on Admission:  Basic Metabolic Panel:  Recent Labs Lab 09/29/13 1637 09/30/13 1144  NA 118* 118*    K 4.9 5.4*  CL 88* 85*  CO2 23 21  GLUCOSE 100* 107*  BUN 16 17  CREATININE 0.73 0.83  CALCIUM 8.2* 8.1*   Liver Function Tests:  Recent Labs Lab 09/29/13 1637  AST 79*  ALT 95*  ALKPHOS 90  BILITOT 0.3  PROT 6.6  ALBUMIN 2.9*   No results found for this basename: LIPASE, AMYLASE,  in the last 168 hours No results found for this basename: AMMONIA,  in the last 168 hours CBC:  Recent Labs Lab 09/29/13 1637 09/30/13 1144  WBC 3.7* 2.8*  NEUTROABS 2.9 2.3  HGB 9.8* 9.6*  HCT 28.2* 27.1*  MCV 84.9 84.2  PLT 186 131*   Cardiac Enzymes: No results found for this basename: CKTOTAL, CKMB, CKMBINDEX, TROPONINI,  in the last 168 hours  BNP (last 3 results) No results found for this basename: PROBNP,  in the last 8760 hours CBG: No results found for this basename: GLUCAP,  in the last 168 hours  Radiological Exams on Admission: Dg Chest 2 View  09/30/2013   CLINICAL DATA:  Hyponatremia  EXAM: CHEST  2 VIEW  COMPARISON:  Chest CT 09/10/2013  FINDINGS: COPD with hyperinflation. Patchy bibasilar airspace disease. This may have improved slightly since the CT scan. There is improved aeration in the lung apices since the prior CT. No effusion.  Heart size is normal.  No evidence heart failure.  IMPRESSION: Improvement in diffuse bilateral airspace disease suggestive of resolving pneumonitis.   Electronically Signed   By: Franchot Gallo M.D.   On: 09/30/2013 13:59    EKG: Independently reviewed. Normal sinus rhythm with nonspecific T-wave changes. RVH.  Assessment/Plan Principal Problem:   Hyponatremia Active Problems:   HTN (hypertension)   Anemia   HIV positive   Orthostasis: by pulse   Hyperkalemia   Pancytopenia   Acute hyponatremia   #1 severe hyponatremia Patient is noted to have a sodium of 118. Likely secondary to hypovolemic hyponatremia. Patient denies any diarrhea or emesis. Patient noted to be orthostatic by pulse. Patient also noted on presentation to the  emergency room, to have systolic blood pressure of 95. Patient is not on any diuretics. We'll admit patient to the step down unit secondary to the severity of his hyponatremia. Will check a urine osmolality. Check a serum osmolality. Check a TSH. Check a cortisol level. Chest x-ray with resolving pneumonitis. Patient is currently receiving a 1 L bolus of IV fluids. We'll place on normal saline at 125 cc per hour. Serial by metastases. Follow. If no improvement or worsening consider nephrology consultation. Follow for now.  #2 hypertension Due to borderline blood pressure Will hold antihypertensive medications for now. Follow.  #3 HIV Patient has not been started on any medications as yet as patient states he is waiting for pharmacy to have you stop refilled. Patient has recently been treated for PCP pneumonia. Patient's CD4 count 09/17/2013 was 97. RPR done on 09/18/2013 was nonreactive. Hepatitis A antibody was nonreactive. Hepatitis C antibody was negative. Viral load was ordered on 09/29/2013 and is currently pending. Will consult with infectious disease for further evaluation and management.  #4 orthostasis by pulse Hydrate with IV fluids. Follow.  #5 pancytopenia Likely secondary to HIV status. Patient with no overt bleeding. We'll monitor for now.  #6 hyperkalemia Will repeat comprehensive metabolic profile this evening. If potassium is still elevated will give some Kayexalate.  #7 prophylaxis PPI for GI prophylaxis. SCDs for DVT prophylaxis.   Code Status: Full Family Communication: Updated patient no family at bedside. Disposition Plan: Admit to step down.  Time spent: [facility] Pager [phone]

## 2013-09-30 NOTE — ED Provider Notes (Signed)
Medical screening examination/treatment/procedure(s) were performed by non-physician practitioner and as supervising physician I was immediately available for consultation/collaboration.  Richarda Blade, MD 09/30/13 2036

## 2013-10-01 ENCOUNTER — Telehealth: Payer: Self-pay

## 2013-10-01 DIAGNOSIS — E43 Unspecified severe protein-calorie malnutrition: Secondary | ICD-10-CM | POA: Insufficient documentation

## 2013-10-01 LAB — CBC
HCT: 24.4 % — ABNORMAL LOW (ref 39.0–52.0)
HEMOGLOBIN: 8.5 g/dL — AB (ref 13.0–17.0)
MCH: 29.5 pg (ref 26.0–34.0)
MCHC: 34.8 g/dL (ref 30.0–36.0)
MCV: 84.7 fL (ref 78.0–100.0)
PLATELETS: 119 10*3/uL — AB (ref 150–400)
RBC: 2.88 MIL/uL — AB (ref 4.22–5.81)
RDW: 14.3 % (ref 11.5–15.5)
WBC: 1.7 10*3/uL — ABNORMAL LOW (ref 4.0–10.5)

## 2013-10-01 LAB — URINE CULTURE
Colony Count: NO GROWTH
Culture: NO GROWTH

## 2013-10-01 LAB — COMPREHENSIVE METABOLIC PANEL
ALT: 121 U/L — ABNORMAL HIGH (ref 0–53)
AST: 103 U/L — ABNORMAL HIGH (ref 0–37)
Albumin: 2.1 g/dL — ABNORMAL LOW (ref 3.5–5.2)
Alkaline Phosphatase: 86 U/L (ref 39–117)
BUN: 12 mg/dL (ref 6–23)
CO2: 20 meq/L (ref 19–32)
CREATININE: 0.77 mg/dL (ref 0.50–1.35)
Calcium: 7.4 mg/dL — ABNORMAL LOW (ref 8.4–10.5)
Chloride: 92 mEq/L — ABNORMAL LOW (ref 96–112)
GLUCOSE: 96 mg/dL (ref 70–99)
Potassium: 4.4 mEq/L (ref 3.7–5.3)
Sodium: 124 mEq/L — ABNORMAL LOW (ref 137–147)
TOTAL PROTEIN: 5.9 g/dL — AB (ref 6.0–8.3)
Total Bilirubin: <0.2 mg/dL — ABNORMAL LOW (ref 0.3–1.2)

## 2013-10-01 LAB — BASIC METABOLIC PANEL
BUN: 13 mg/dL (ref 6–23)
CO2: 21 mEq/L (ref 19–32)
Calcium: 7.4 mg/dL — ABNORMAL LOW (ref 8.4–10.5)
Chloride: 98 mEq/L (ref 96–112)
Creatinine, Ser: 0.65 mg/dL (ref 0.50–1.35)
GFR calc Af Amer: >90 mL/min (ref >90)
GFR calc non Af Amer: >90 mL/min (ref >90)
GLUCOSE: 115 mg/dL — AB (ref 70–99)
POTASSIUM: 4.7 meq/L (ref 3.7–5.3)
SODIUM: 130 meq/L — AB (ref 137–147)

## 2013-10-01 LAB — HIV-1 RNA QUANT-NO REFLEX-BLD
HIV 1 RNA QUANT: 317103 {copies}/mL — AB (ref <20)
HIV-1 RNA QUANT, LOG: 5.5 {Log} — AB (ref <1.30)

## 2013-10-01 LAB — CORTISOL: Cortisol, Plasma: 21.1 ug/dL

## 2013-10-01 LAB — TSH: TSH: 1.614 u[IU]/mL (ref 0.350–4.500)

## 2013-10-01 MED ORDER — ENSURE COMPLETE PO LIQD
237.0000 mL | Freq: Two times a day (BID) | ORAL | Status: DC
  Administered 2013-10-01 – 2013-10-02 (×2): 237 mL via ORAL

## 2013-10-01 MED ORDER — SULFAMETHOXAZOLE-TMP DS 800-160 MG PO TABS
1.0000 | ORAL_TABLET | Freq: Every day | ORAL | Status: DC
  Administered 2013-10-01 – 2013-10-02 (×2): 1 via ORAL
  Filled 2013-10-01 (×2): qty 1

## 2013-10-01 NOTE — Progress Notes (Signed)
[facility] TEAM 1 - Stepdown/ICU TEAM Progress Note  Jeffery Franklin VQQ:[id number] DOB: 1950-09-11 DOA: 09/30/2013 PCP: Wyatt Haste, MD  Admit HPI / Brief Narrative: 63 y.o. male diagnosed with HIV on 09/17/2013 with absolute CD4 count of 97. Viral load is pending. Patient with history of hypertension, hyperlipidemia, history of right temple basal cell carcinoma 2013, history of anemia who presented to the ED secondary to abnormal labs obtained at PCPs office with a sodium of 118.  Patient did endorse some weakness. Patient stated he'd lost about 10 pounds since October of 2014.  Patient stated he was recently treated for probable PCP pneumonia where he took Bactrim for about 4-5 days as well as prednisone.   Patient states his PCP obtained some labs about a week prior to admission and he was called and informed his sodium was 118 and was subsequently told to present to the ED. In the emergency room basic metabolic profile confirmed a sodium of 118 potassium of 5.4 chloride of 85 otherwise was within normal limits. CBC had a white count of 2.8 hemoglobin 9.6 and a platelet count of 131 otherwise was within normal limits. Urinalysis done was unremarkable. Urine sodium was 22.   HPI/Subjective: The patient feels much better today.  He denies tremulousness or focal weakness.  He states his generalized weakness is improving.  He feels that his mental status is clear.  Assessment/Plan:  Severe hyponatremia - severe dehydration/orthostasis Findings most c/w hypovolemic hyponatremia - Na is slowly improving w/ volume resuscitation - cont to follow - should be ready for d/c in ~24hrs if continues to climb at current rate - recheck tonight to assure not over-rapidly correcting   AIDS/HIV Per ID will hold Stribild pending normalization of sodium (start at discharge per ID) - needs to be on Bactrim DS 1 tab daily for PCP prophylaxis - follow up in [facility] in about 2-3 weeks  Pancytopenia Due to  above - follow trend with initiation of treatment  Hyperkalemia Resolved w/ volume resuscitation   Hypertension Not an active problem at this time   Transaminitis Likely due to hypoperfusion - recheck in AM   Severe malnutrition in the context of chronic illness / Underweight  Appetite improving   Code Status: FULL Family Communication: no family present at time of exam Disposition Plan: SDU (will not transfer at pt likely for d/c in AM)  Consultants: ID  Procedures: None  Antibiotics: Bactrim prophy  DVT prophylaxis: SCDs  Objective: Blood pressure 112/63, pulse 75, temperature 97.9 F (36.6 C), temperature source Oral, resp. rate 23, height 5\' 10"  (1.778 m), weight 55.4 kg (122 lb 2.2 oz), SpO2 100.00%.  Intake/Output Summary (Last 24 hours) at 10/01/13 1636 Last data filed at 10/01/13 1548  Gross per 24 hour  Intake   2748 ml  Output   2200 ml  Net    548 ml   Exam: General: No acute respiratory distress - cachectic  Lungs: Clear to auscultation bilaterally without wheezes or crackles Cardiovascular: Regular rate and rhythm without murmur gallop or rub normal S1 and S2 Abdomen: Nontender, nondistended, soft, bowel sounds positive, no rebound, no ascites, no appreciable mass Extremities: No significant cyanosis, clubbing, or edema bilateral lower extremities  Data Reviewed: Basic Metabolic Panel:  Recent Labs Lab 09/29/13 1637 09/30/13 1144 09/30/13 1830 10/01/13 0315  NA 118* 118* 122* 124*  K 4.9 5.4* 4.1 4.4  CL 88* 85* 90* 92*  CO2 23 21 21 20   GLUCOSE 100* 107* 126* 96  BUN 16 17 13 12   CREATININE 0.73 0.83 0.74 0.77  CALCIUM 8.2* 8.1* 7.7* 7.4*  MG  --  1.7  --   --    Liver Function Tests:  Recent Labs Lab 09/29/13 1637 09/30/13 1830 10/01/13 0315  AST 79* 106* 103*  ALT 95* 119* 121*  ALKPHOS 90 90 86  BILITOT 0.3 0.2* <0.2*  PROT 6.6 6.4 5.9*  ALBUMIN 2.9* 2.3* 2.1*   CBC:  Recent Labs Lab 09/29/13 1637 09/30/13 1144  10/01/13 0315  WBC 3.7* 2.8* 1.7*  NEUTROABS 2.9 2.3  --   HGB 9.8* 9.6* 8.5*  HCT 28.2* 27.1* 24.4*  MCV 84.9 84.2 84.7  PLT 186 131* 119*    Recent Results (from the past 240 hour(s))  URINE CULTURE     Status: None   Collection Time    09/30/13  1:55 PM      Result Value Ref Range Status   Specimen Description URINE, CLEAN CATCH   Final   Special Requests NONE   Final   Culture  Setup Time     Final   Value: 09/30/2013 17:05     Performed at [place] [facility]     Final   Value: NO GROWTH     Performed at [facility]   Culture     Final   Value: NO GROWTH     Performed at [facility]   Report Status 10/01/2013 FINAL   Final  MRSA PCR SCREENING     Status: None   Collection Time    09/30/13  4:05 PM      Result Value Ref Range Status   MRSA by PCR NEGATIVE  NEGATIVE Final   Comment:            The GeneXpert MRSA Assay (FDA     approved for NASAL specimens     only), is one component of a     comprehensive MRSA colonization     surveillance program. It is not     intended to diagnose MRSA     infection nor to guide or     monitor treatment for     MRSA infections.     Studies:  Recent x-ray studies have been reviewed in detail by the Attending Physician  Scheduled Meds:  Scheduled Meds: . feeding supplement (ENSURE COMPLETE)  237 mL Oral BID BM  . pantoprazole  80 mg Oral BID AC  . sodium chloride  3 mL Intravenous Q12H  . sulfamethoxazole-trimethoprim  1 tablet Oral Daily    Time spent on care of this patient: 35 mins   [place]  [phone] Pager - Text Page per Shea Evans as per below:  On-Call/Text Page:      [email or web address]      password [email or web address]  If 7PM-7AM, please contact night-coverage [email or web address] Password [email or web address] 10/01/2013, 4:36 PM   LOS: 1 day

## 2013-10-01 NOTE — Telephone Encounter (Signed)
Referral received.  Patient will need appointment with ID physician. No intake required .   Viral load and Genotype completed through Dr Lanice Shirts office.   Laverle Patter, RN

## 2013-10-01 NOTE — Progress Notes (Signed)
    [place] for Infectious Disease  Date of Admission:  09/30/2013  Antibiotics: Bactrim prophylaxis  Subjective: No complaints  Objective: Temp:  [97.9 F (36.6 C)-98.5 F (36.9 C)] 97.9 F (36.6 C) (03/04 1233) Pulse Rate:  [72-97] 72 (03/04 1233) Resp:  [18-28] 26 (03/04 1233) BP: (99-136)/(50-75) 100/60 mmHg (03/04 1233) SpO2:  [96 %-100 %] 99 % (03/04 1233) Weight:  [122 lb 2.2 oz (55.4 kg)] 122 lb 2.2 oz (55.4 kg) (03/04 0422)  General: Awake, alert, nad Skin: no rashes Lungs: CTA B Cor: RRR without m Abdomen: soft,nt,nd   Lab Results Lab Results  Component Value Date   WBC 1.7* 10/01/2013   HGB 8.5* 10/01/2013   HCT 24.4* 10/01/2013   MCV 84.7 10/01/2013   PLT 119* 10/01/2013    Lab Results  Component Value Date   CREATININE 0.77 10/01/2013   BUN 12 10/01/2013   NA 124* 10/01/2013   K 4.4 10/01/2013   CL 92* 10/01/2013   CO2 20 10/01/2013    Lab Results  Component Value Date   ALT 121* 10/01/2013   AST 103* 10/01/2013   ALKPHOS 86 10/01/2013   BILITOT <0.2* 10/01/2013      Microbiology: Recent Results (from the past 240 hour(s))  MRSA PCR SCREENING     Status: None   Collection Time    09/30/13  4:05 PM      Result Value Ref Range Status   MRSA by PCR NEGATIVE  NEGATIVE Final   Comment:            The GeneXpert MRSA Assay (FDA     approved for NASAL specimens     only), is one component of a     comprehensive MRSA colonization     surveillance program. It is not     intended to diagnose MRSA     infection nor to guide or     monitor treatment for     MRSA infections.    Studies/Results: Dg Chest 2 View  09/30/2013   CLINICAL DATA:  Hyponatremia  EXAM: CHEST  2 VIEW  COMPARISON:  Chest CT 09/10/2013  FINDINGS: COPD with hyperinflation. Patchy bibasilar airspace disease. This may have improved slightly since the CT scan. There is improved aeration in the lung apices since the prior CT. No effusion.  Heart size is normal.  No evidence heart failure.  IMPRESSION:  Improvement in diffuse bilateral airspace disease suggestive of resolving pneumonitis.   Electronically Signed   By: Franchot Gallo M.D.   On: 09/30/2013 13:59    Assessment/Plan: 1)  HIV - will start at discharge.  His partner is picking it up today.  Will start with Stribild.   -he will follow up in [facility] in about 2-3 weeks -appropriate labs have been done by his primary -genotype without resistance  2) Pneumonia - he was treated with bactrim and steroids for about 5 days.  He now though is asymptomatic, no hypoxia or SOB. There is a recording of RR in 20s but the patient did not feel sob.  He at this time does not have any evidence of pneumonia including PCP.  Therefore I will continue him on PCP prophylaxis.    I will sign off, call with questions  Scharlene Gloss, [facility] [place] [email or web address] [phone] pager   [phone] cell 10/01/2013, 2:49 PM

## 2013-10-01 NOTE — Progress Notes (Signed)
INITIAL NUTRITION ASSESSMENT  DOCUMENTATION CODES Per approved criteria  -Severe malnutrition in the context of chronic illness -Underweight   INTERVENTION: Ensure Complete po BID, each supplement provides 350 kcal and 13 grams of protein RD to follow for nutrition care plan  NUTRITION DIAGNOSIS: Increased nutrient needs related to catabolic illness as evidenced by estimated nutrition needs  Goal: Pt to meet >/= 90% of their estimated nutrition needs   Monitor:  PO & supplemental intake, weight, labs, I/O's  Reason for Assessment: Malnutrition Screening Tool Report  63 y.o. male  Admitting Dx: Hyponatremia  ASSESSMENT: Patient with PMH of HIV (dx 2 weeks ago), HTN and HLD; presented to ED secondary to abnormal labs obtained at PCPs office with a sodium of 118; admitted for further evaluation and management.  Patient reports his appetite is doing better; states he didn't eat dinner well last night but breakfast "was wonderful;" no % PO intake recorded per flowsheets; also endorses uninetnional weight loss since his HIV diagnosis; per weight readings, pt has had a 8% weight loss x 1 month (February 2015); drinks Boost supplements at home; amenable to Ensure Complete during hospitalization -- RD to order.  Nutrition Focused Physical Exam:  Subcutaneous Fat:  Orbital Region: WNL Upper Arm Region: severe depletion Thoracic and Lumbar Region: N/A  Muscle:  Temple Region: moderate depletion Clavicle Bone Region: severe depletion Clavicle and Acromion Bone Region: severe depletion Scapular Bone Region: N/A Dorsal Hand: N/A Patellar Region: N/A Anterior Thigh Region: moderate depletion Posterior Calf Region: moderate depletion  Edema: none  Patient meets criteria for severe malnutrition in the context of chronic illness as evidenced by moderate-severe muscle & subcutaneous fat loss and 8% weight loss x 1 month.  Height: Ht Readings from Last 1 Encounters:  09/30/13 5\' 10"   (1.778 m)    Weight: Wt Readings from Last 1 Encounters:  10/01/13 122 lb 2.2 oz (55.4 kg)    Ideal Body Weight: 166 lb  % Ideal Body Weight: 73%  Wt Readings from Last 10 Encounters:  10/01/13 122 lb 2.2 oz (55.4 kg)  09/29/13 123 lb (55.792 kg)  09/18/13 127 lb (57.607 kg)  09/17/13 128 lb (58.06 kg)  09/12/13 133 lb (60.328 kg)  09/04/13 133 lb 9.6 oz (60.601 kg)  08/29/13 133 lb (60.328 kg)  08/22/13 138 lb (62.596 kg)  08/13/13 139 lb 12.8 oz (63.413 kg)  08/12/13 141 lb (63.957 kg)    Usual Body Weight: 133 lb  % Usual Body Weight: 92%  BMI:  Body mass index is 17.52 kg/(m^2).  Estimated Nutritional Needs: Kcal: 1950-2150 Protein: 100-110 gm Fluid: 1.9-2.1 L  Skin: Intact  Diet Order: General  EDUCATION NEEDS: -No education needs identified at this time   Intake/Output Summary (Last 24 hours) at 10/01/13 1304 Last data filed at 10/01/13 1234  Gross per 24 hour  Intake   1623 ml  Output   1800 ml  Net   -177 ml    Labs:   Recent Labs Lab 09/29/13 1637 09/30/13 1144 09/30/13 1830 10/01/13 0315  NA 118* 118* 122* 124*  K 4.9 5.4* 4.1 4.4  CL 88* 85* 90* 92*  CO2 23 21 21 20   BUN 16 17 13 12   CREATININE 0.73 0.83 0.74 0.77  CALCIUM 8.2* 8.1* 7.7* 7.4*  MG  --  1.7  --   --   GLUCOSE 100* 107* 126* 96    Scheduled Meds: . pantoprazole  80 mg Oral BID AC  . sodium chloride  3  mL Intravenous Q12H    Continuous Infusions: . sodium chloride 1,000 mL (10/01/13 0848)    Past Medical History  Diagnosis Date  . Osteoporosis     osteopenia in past  . Hypertension   . Hyperlipidemia   . Basal cell carcinoma     right temple/ 2013  . Anemia   . HIV (human immunodeficiency virus infection)     Past Surgical History  Procedure Laterality Date  . Colonoscopy    . Skin cancer removed      right temple    Arthur Holms, RD, LDN Pager #: [phone] After-Hours Pager #: [phone]

## 2013-10-01 NOTE — Evaluation (Signed)
One Time Physical Therapy Evaluation Patient Details Name: ASCENCION COYE MRN: [id number] DOB: Dec 31, 1950 Today's Date: 10/01/2013 Time: 4315-4008 PT Time Calculation (min): 13 min  PT Assessment / Plan / Recommendation History of Present Illness  Pt admit for hyponatremia.  Recent diagnosis of HIV/AIDs and pt has not gone to refill medicine yet.    Clinical Impression  Pt admitted with above. Pt currently with no significant functional limitations.  Pt will not need skilled PT as he is functioning at or better than supervision level.  Will sign off as pt does not need PT services.   PT Assessment  Patent does not need any further PT services    Follow Up Recommendations  No PT follow up    Does the patient have the potential to tolerate intense rehabilitation      Barriers to Discharge        Equipment Recommendations  Cane (for ultimate stability but pt declines)    Recommendations for Other Services     Frequency      Precautions / Restrictions Precautions Precautions: Fall Restrictions Weight Bearing Restrictions: No   Pertinent Vitals/Pain VSS, No pain      Mobility  Bed Mobility Overal bed mobility: Independent Transfers Overall transfer level: Independent Ambulation/Gait Ambulation/Gait assistance: Supervision Ambulation Distance (Feet): 350 Feet Assistive device: None Gait Pattern/deviations: Staggering left;Staggering right Gait velocity interpretation: at or above normal speed for age/gender General Gait Details: Pt occasional staggering but not to the point of needing assist.  Pt feels that he is close to baseline status.  Pt informed that he may benefit from use of cane for ultimate stability since he is somewhat weak.      Exercises     PT Diagnosis:    PT Problem List:   PT Treatment Interventions:       PT Goals(Current goals can be found in the care plan section)    Visit Information  Last PT Received On: 10/01/13 Assistance Needed:  +1 History of Present Illness: Pt admit for hyponatremia.  Recent diagnosis of HIV/AIDs and pt has not gone to refill medicine yet.         Prior [place] expects to be discharged to:: Private residence Living Arrangements: Spouse/significant other Available Help at Discharge: Friend(s);Available 24 hours/day Type of Home: House Home Access: Stairs to enter [employer] of Steps: 2 Entrance Stairs-Rails: None Home Layout: One level Home Equipment: None Prior Function Level of Independence: Independent Communication Communication: No difficulties    Cognition  Cognition Arousal/Alertness: Awake/alert Behavior During Therapy: WFL for tasks assessed/performed Overall Cognitive Status: Within Functional Limits for tasks assessed    Extremity/Trunk Assessment Upper Extremity Assessment Upper Extremity Assessment: Defer to OT evaluation Lower Extremity Assessment Lower Extremity Assessment: Generalized weakness Cervical / Trunk Assessment Cervical / Trunk Assessment: Normal   Balance Balance Overall balance assessment: Needs assistance Standing balance support: No upper extremity supported;During functional activity Standing balance-Leahy Scale: Good Standing balance comment: Occasional staggering of which he self corrects.   End of Session PT - End of Session Equipment Utilized During Treatment: Gait belt Activity Tolerance: Patient tolerated treatment well Patient left: with call bell/phone within reach;in chair Nurse Communication: Mobility status  GP     INGOLD,Aries Townley 10/01/2013, 11:05 AM [facility] [phone] (pager)

## 2013-10-02 DIAGNOSIS — E43 Unspecified severe protein-calorie malnutrition: Secondary | ICD-10-CM

## 2013-10-02 LAB — BASIC METABOLIC PANEL
BUN: 11 mg/dL (ref 6–23)
CHLORIDE: 101 meq/L (ref 96–112)
CO2: 24 mEq/L (ref 19–32)
Calcium: 7.5 mg/dL — ABNORMAL LOW (ref 8.4–10.5)
Creatinine, Ser: 0.67 mg/dL (ref 0.50–1.35)
GFR calc Af Amer: >90 mL/min (ref >90)
GFR calc non Af Amer: >90 mL/min (ref >90)
Glucose, Bld: 113 mg/dL — ABNORMAL HIGH (ref 70–99)
POTASSIUM: 4.8 meq/L (ref 3.7–5.3)
Sodium: 133 mEq/L — ABNORMAL LOW (ref 137–147)

## 2013-10-02 MED ORDER — POLYETHYLENE GLYCOL 3350 17 G PO PACK: 17.0000 g | PACK | Freq: Every day | ORAL | Status: DC | PRN

## 2013-10-02 MED ORDER — ENSURE COMPLETE PO LIQD: 237.0000 mL | Freq: Two times a day (BID) | ORAL | Status: DC

## 2013-10-02 NOTE — Discharge Summary (Signed)
Physician Discharge Summary  Jeffery Franklin [id number] DOB: March 06, 1951 DOA: 09/30/2013  PCP: Wyatt Haste, MD  Admit date: 09/30/2013 Discharge date: 10/02/2013  Time spent: >45 minutes  Recommendations for Outpatient Follow-up:  1. Repeat LFTs and sodium level in 1 wk.   Discharge Diagnoses:  Principal Problem:   Hyponatremia Active Problems:   HTN (hypertension)   Anemia   HIV positive   Orthostasis: by pulse   Hyperkalemia   Pancytopenia   Acute hyponatremia   Protein-calorie malnutrition, severe   Discharge Condition: stable  Diet recommendation: regular diet  Filed Weights   09/30/13 1536 10/01/13 0422 10/02/13 0400  Weight: 55.4 kg (122 lb 2.2 oz) 55.4 kg (122 lb 2.2 oz) 55.4 kg (122 lb 2.2 oz)    History of present illness:  63 y.o. male diagnosed with HIV on 09/17/2013 with absolute CD4 count of 97. Viral load is pending. Patient with history of hypertension, hyperlipidemia, history of right temple basal cell carcinoma 2013, history of anemia who presented to the ED secondary to abnormal labs obtained at PCPs office with a sodium of 118.  Patient did endorse some weakness. Patient stated he'd lost about 10 pounds since October of 2014. Patient stated he was recently treated for probable PCP pneumonia where he took Bactrim for about 4-5 days as well as prednisone.  Patient states his PCP obtained some labs about a week prior to admission and he was called and informed his sodium was 118 and was subsequently told to present to the ED. In the emergency room basic metabolic profile confirmed a sodium of 118 potassium of 5.4 chloride of 85 otherwise was within normal limits. CBC had a white count of 2.8 hemoglobin 9.6 and a platelet count of 131 otherwise was within normal limits. Urinalysis done was unremarkable. Urine sodium was 22.    Hospital Course:  Severe hyponatremia - severe dehydration/orthostasis  Findings most c/w hypovolemic hyponatremia - Na is slowly  improving w/ volume resuscitation - now up to 133 - will cont to hold ARB on d/c   AIDS/HIV  Per ID will hold Stribild pending normalization of sodium (start at discharge per ID) - needs to be on Bactrim DS 1 tab daily for PCP prophylaxis - follow up in [facility] in about 2-3 weeks   Pancytopenia  Due to above - follow trend with initiation of treatment   Hyperkalemia  Resolved w/ volume resuscitation   Hypertension  Not an active problem at this time   Transaminitis  Likely due to hypoperfusion - recheck as outpt  Severe malnutrition in the context of chronic illness / Underweight  Appetite improving - cont ensure   Procedures:  none  Consultations:  none  Discharge Exam: Filed Vitals:   10/02/13 0750  BP: 127/88  Pulse: 65  Temp: 97.7 F (36.5 C)  Resp: 33    General: No acute respiratory distress - cachectic  Lungs: Clear to auscultation bilaterally without wheezes or crackles  Cardiovascular: Regular rate and rhythm without murmur gallop or rub normal S1 and S2  Abdomen: Nontender, nondistended, soft, bowel sounds positive, no rebound, no ascites, no appreciable mass  Extremities: No significant cyanosis, clubbing, or edema bilateral lower extremities   Discharge Instructions      Discharge Orders   Future Appointments Provider Department Dept Phone   10/29/2013 4:15 PM Denita Lung, MD [place] [phone]   Future Orders Complete By Expires   Discharge instructions  As directed    Comments:  Regular diet - do not go back on the BP medication - let your doctor decide if you need it or another one   Increase activity slowly  As directed        Medication List    STOP taking these medications       telmisartan 40 MG tablet  Commonly known as:  MICARDIS      TAKE these medications       B-complex with vitamin C tablet  Take 1 tablet by mouth daily.     elvitegravir-cobicistat-emtricitabine-tenofovir 150-150-200-300 MG Tabs  tablet  Commonly known as:  STRIBILD  Take 1 tablet by mouth daily with breakfast.     feeding supplement (ENSURE COMPLETE) Liqd  Take 237 mLs by mouth 2 (two) times daily between meals.     MULTIVITAMIN/IRON PO  Take 1 tablet by mouth daily.     omeprazole 40 MG capsule  Commonly known as:  PRILOSEC  Take 40 mg by mouth 2 (two) times daily. Take 30- 60 min before your first and last meals of the day     polyethylene glycol packet  Commonly known as:  MIRALAX / GLYCOLAX  Take 17 g by mouth daily as needed for mild constipation.     sulfamethoxazole-trimethoprim 800-160 MG per tablet  Commonly known as:  BACTRIM DS  Take 2 tablets by mouth 4 (four) times daily.       No Known Allergies Follow-up Information   Follow up with Wyatt Haste, MD In 1 week. (Patient has an appt already)    Specialty:  Family Medicine   Contact information:   [place] [address] [phone]        The results of significant diagnostics from this hospitalization (including imaging, microbiology, ancillary and laboratory) are listed below for reference.    Significant Diagnostic Studies: Dg Chest 2 View  09/30/2013   CLINICAL DATA:  Hyponatremia  EXAM: CHEST  2 VIEW  COMPARISON:  Chest CT 09/10/2013  FINDINGS: COPD with hyperinflation. Patchy bibasilar airspace disease. This may have improved slightly since the CT scan. There is improved aeration in the lung apices since the prior CT. No effusion.  Heart size is normal.  No evidence heart failure.  IMPRESSION: Improvement in diffuse bilateral airspace disease suggestive of resolving pneumonitis.   Electronically Signed   By: Franchot Gallo M.D.   On: 09/30/2013 13:59   Ct Chest W Contrast  09/10/2013   CLINICAL DATA:  Early satiety, abdominal pain, weight loss. Prior smoker.  EXAM: CT CHEST, ABDOMEN, AND PELVIS WITH CONTRAST  TECHNIQUE: Multidetector CT imaging of the chest, abdomen and pelvis was performed following  the standard protocol during bolus administration of intravenous contrast.  CONTRAST:  173mL OMNIPAQUE IOHEXOL 300 MG/ML  SOLN  COMPARISON:  None.  FINDINGS: CT CHEST FINDINGS  There are ground-glass opacities throughout the lungs. These are predominantly peribronchovascular There clustered nodular densities posteriorly in the right upper lobe on images 25 and 26. Coarsened linear densities and Early architectural distortion in the anterior right upper lobe. No suspicious pulmonary nodules. No pleural effusions.  Heart is normal size. Aorta is normal caliber. No mediastinal, hilar, or axillary adenopathy. Chest wall soft tissues are unremarkable.  No acute bony abnormality.  CT ABDOMEN AND PELVIS FINDINGS  Small hypodensity peripherally in the right hepatic lobe measuring 5 mm. This is too small to characterize but most likely a small cyst. Spleen, gallbladder, pancreas, adrenals and kidneys are unremarkable.  Urinary bladder wall appears  slightly thickened diffusely. Cannot exclude cystitis. This may also be related to mild prostate enlargement. Moderate stool in the right colon and transverse colon. Descending colon is decompressed. No evidence for obstructing lesion or process. No free fluid, free air or adenopathy. Small/shotty retroperitoneal lymph nodes. Indexed node on image 75 has a short axis diameter of 6 mm.  Degenerative changes in the lower lumbar spine. No acute bony abnormality.  IMPRESSION: Ground-glass peribronchovascular opacities within the lungs. There is some architectural distortion noted in the anterior right upper lobe. This is nonspecific, with the differential including subacute hypersensitivity pneumonitis, atypical/ viral pneumonia, or less likely organizing pneumonia.  Mild bladder wall thickening. While this could reflect cystitis, I suspect is related to mild bladder outlet obstruction and enlarged prostate.  No explanation seen to the patient's weight loss or early satiety.    Electronically Signed   By: Rolm Baptise M.D.   On: 09/10/2013 16:05   Ct Abdomen Pelvis W Contrast  09/10/2013   CLINICAL DATA:  Early satiety, abdominal pain, weight loss. Prior smoker.  EXAM: CT CHEST, ABDOMEN, AND PELVIS WITH CONTRAST  TECHNIQUE: Multidetector CT imaging of the chest, abdomen and pelvis was performed following the standard protocol during bolus administration of intravenous contrast.  CONTRAST:  191mL OMNIPAQUE IOHEXOL 300 MG/ML  SOLN  COMPARISON:  None.  FINDINGS: CT CHEST FINDINGS  There are ground-glass opacities throughout the lungs. These are predominantly peribronchovascular There clustered nodular densities posteriorly in the right upper lobe on images 25 and 26. Coarsened linear densities and Early architectural distortion in the anterior right upper lobe. No suspicious pulmonary nodules. No pleural effusions.  Heart is normal size. Aorta is normal caliber. No mediastinal, hilar, or axillary adenopathy. Chest wall soft tissues are unremarkable.  No acute bony abnormality.  CT ABDOMEN AND PELVIS FINDINGS  Small hypodensity peripherally in the right hepatic lobe measuring 5 mm. This is too small to characterize but most likely a small cyst. Spleen, gallbladder, pancreas, adrenals and kidneys are unremarkable.  Urinary bladder wall appears slightly thickened diffusely. Cannot exclude cystitis. This may also be related to mild prostate enlargement. Moderate stool in the right colon and transverse colon. Descending colon is decompressed. No evidence for obstructing lesion or process. No free fluid, free air or adenopathy. Small/shotty retroperitoneal lymph nodes. Indexed node on image 75 has a short axis diameter of 6 mm.  Degenerative changes in the lower lumbar spine. No acute bony abnormality.  IMPRESSION: Ground-glass peribronchovascular opacities within the lungs. There is some architectural distortion noted in the anterior right upper lobe. This is nonspecific, with the differential  including subacute hypersensitivity pneumonitis, atypical/ viral pneumonia, or less likely organizing pneumonia.  Mild bladder wall thickening. While this could reflect cystitis, I suspect is related to mild bladder outlet obstruction and enlarged prostate.  No explanation seen to the patient's weight loss or early satiety.   Electronically Signed   By: Rolm Baptise M.D.   On: 09/10/2013 16:05    Microbiology: Recent Results (from the past 240 hour(s))  URINE CULTURE     Status: None   Collection Time    09/30/13  1:55 PM      Result Value Ref Range Status   Specimen Description URINE, CLEAN CATCH   Final   Special Requests NONE   Final   Culture  Setup Time     Final   Value: 09/30/2013 17:05     Performed at [place]  Final   Value: NO GROWTH     Performed at [facility]   Culture     Final   Value: NO GROWTH     Performed at [facility]   Report Status 10/01/2013 FINAL   Final  MRSA PCR SCREENING     Status: None   Collection Time    09/30/13  4:05 PM      Result Value Ref Range Status   MRSA by PCR NEGATIVE  NEGATIVE Final   Comment:            The GeneXpert MRSA Assay (FDA     approved for NASAL specimens     only), is one component of a     comprehensive MRSA colonization     surveillance program. It is not     intended to diagnose MRSA     infection nor to guide or     monitor treatment for     MRSA infections.     Labs: Basic Metabolic Panel:  Recent Labs Lab 09/29/13 1637 09/30/13 1144 09/30/13 1830 10/01/13 0315 10/01/13 1855 10/02/13 0228  NA 118* 118* 122* 124* 130* 133*  K 4.9 5.4* 4.1 4.4 4.7 4.8  CL 88* 85* 90* 92* 98 101  CO2 23 21 21 20 21 24   GLUCOSE 100* 107* 126* 96 115* 113*  BUN 16 17 13 12 13 11   CREATININE 0.73 0.83 0.74 0.77 0.65 0.67  CALCIUM 8.2* 8.1* 7.7* 7.4* 7.4* 7.5*  MG  --  1.7  --   --   --   --    Liver Function Tests:  Recent Labs Lab 09/29/13 1637 09/30/13 1830  10/01/13 0315  AST 79* 106* 103*  ALT 95* 119* 121*  ALKPHOS 90 90 86  BILITOT 0.3 0.2* <0.2*  PROT 6.6 6.4 5.9*  ALBUMIN 2.9* 2.3* 2.1*   No results found for this basename: LIPASE, AMYLASE,  in the last 168 hours No results found for this basename: AMMONIA,  in the last 168 hours CBC:  Recent Labs Lab 09/29/13 1637 09/30/13 1144 10/01/13 0315  WBC 3.7* 2.8* 1.7*  NEUTROABS 2.9 2.3  --   HGB 9.8* 9.6* 8.5*  HCT 28.2* 27.1* 24.4*  MCV 84.9 84.2 84.7  PLT 186 131* 119*   Cardiac Enzymes: No results found for this basename: CKTOTAL, CKMB, CKMBINDEX, TROPONINI,  in the last 168 hours BNP: BNP (last 3 results) No results found for this basename: PROBNP,  in the last 8760 hours CBG: No results found for this basename: GLUCAP,  in the last 168 hours     Signed:  Debbe Odea, MD [facility] 10/02/2013, 10:55 AM

## 2013-10-02 NOTE — Progress Notes (Signed)
Pt discharged to home; Discharge instructions given and all questions answered; All pt belongings with pt which included cell phone, e-reader, wallet and clothing; pt taken to discharge via wheelchair by NT.

## 2013-10-02 NOTE — Progress Notes (Signed)
OT Cancellation Note  Patient Details Name: Jeffery Franklin MRN: [id number] DOB: 30-Dec-1950   Cancelled Treatment:    Reason Eval/Treat Not Completed: OT screened, no needs identified, will sign off.  Malka So 10/02/2013, 8:52 AM

## 2013-10-08 ENCOUNTER — Telehealth: Payer: Self-pay

## 2013-10-08 NOTE — Telephone Encounter (Signed)
Patient has refused Infectious Disease HFU.  He says Dr Redmond School will be following him for this .  Laverle Patter, RN

## 2013-10-09 ENCOUNTER — Encounter: Payer: Self-pay | Admitting: Family Medicine

## 2013-10-09 ENCOUNTER — Ambulatory Visit (INDEPENDENT_AMBULATORY_CARE_PROVIDER_SITE_OTHER): Payer: 59 | Admitting: Family Medicine

## 2013-10-09 VITALS — BP 120/70 | HR 68 | Wt 130.0 lb

## 2013-10-09 DIAGNOSIS — E871 Hypo-osmolality and hyponatremia: Secondary | ICD-10-CM

## 2013-10-09 DIAGNOSIS — R918 Other nonspecific abnormal finding of lung field: Secondary | ICD-10-CM

## 2013-10-09 LAB — CBC WITH DIFFERENTIAL/PLATELET
BASOS PCT: 0 % (ref 0–1)
Basophils Absolute: 0 10*3/uL (ref 0.0–0.1)
Eosinophils Absolute: 0 10*3/uL (ref 0.0–0.7)
Eosinophils Relative: 0 % (ref 0–5)
HEMATOCRIT: 24.5 % — AB (ref 39.0–52.0)
HEMOGLOBIN: 8.3 g/dL — AB (ref 13.0–17.0)
LYMPHS ABS: 0.8 10*3/uL (ref 0.7–4.0)
LYMPHS PCT: 14 % (ref 12–46)
MCH: 30.2 pg (ref 26.0–34.0)
MCHC: 33.9 g/dL (ref 30.0–36.0)
MCV: 89.1 fL (ref 78.0–100.0)
MONO ABS: 0.9 10*3/uL (ref 0.1–1.0)
MONOS PCT: 17 % — AB (ref 3–12)
NEUTROS ABS: 3.8 10*3/uL (ref 1.7–7.7)
Neutrophils Relative %: 69 % (ref 43–77)
Platelets: 211 10*3/uL (ref 150–400)
RBC: 2.75 MIL/uL — AB (ref 4.22–5.81)
RDW: 17.4 % — ABNORMAL HIGH (ref 11.5–15.5)
WBC: 5.5 10*3/uL (ref 4.0–10.5)

## 2013-10-09 LAB — COMPREHENSIVE METABOLIC PANEL
ALK PHOS: 99 U/L (ref 39–117)
ALT: 61 U/L — ABNORMAL HIGH (ref 0–53)
AST: 26 U/L (ref 0–37)
Albumin: 3 g/dL — ABNORMAL LOW (ref 3.5–5.2)
BUN: 16 mg/dL (ref 6–23)
CALCIUM: 8 mg/dL — AB (ref 8.4–10.5)
CHLORIDE: 95 meq/L — AB (ref 96–112)
CO2: 26 mEq/L (ref 19–32)
Creat: 0.84 mg/dL (ref 0.50–1.35)
GLUCOSE: 98 mg/dL (ref 70–99)
POTASSIUM: 4.7 meq/L (ref 3.5–5.3)
SODIUM: 128 meq/L — AB (ref 135–145)
TOTAL PROTEIN: 6 g/dL (ref 6.0–8.3)
Total Bilirubin: 0.3 mg/dL (ref 0.2–1.2)

## 2013-10-09 NOTE — Patient Instructions (Signed)
Continue on the Bactrim for another week and then start taking one a day

## 2013-10-09 NOTE — Progress Notes (Signed)
   Subjective:    Patient ID: Jeffery Franklin, male    DOB: 30-Sep-1950, 63 y.o.   MRN: [id number]  HPI He is here for followup after recent hospitalization for hyponatremia. He was started on Stdribild. He continues on Septra and has been on this for approximately 3 weeks. No nausea, vomiting, headache. He is eating much better and has actually gained weight. He has noted improvement in both balance and strength and stamina He has noted some lower extremity edema especially later on in the day but does get better when he wakes up in the morning he is also stop the antihypertensive.   Review of Systems     Objective:   Physical Exam Alert and in no distress. Lungs are clear to auscultation. His pulse ox and does desaturate down to 88 with walking. Prior to his antibiotics he descended down to 6.       Assessment & Plan:  Acute hyponatremia - Plan: Comprehensive metabolic panel  Pulmonary infiltrates assoc with HIV Pos status (new dx 08/17/13) - Plan: CBC with Differential, Comprehensive metabolic panel

## 2013-10-10 NOTE — Addendum Note (Signed)
Addended by: Denita Lung on: 10/10/2013 11:55 AM   Modules accepted: Orders

## 2013-10-16 ENCOUNTER — Encounter: Payer: Self-pay | Admitting: Family Medicine

## 2013-10-16 ENCOUNTER — Ambulatory Visit (INDEPENDENT_AMBULATORY_CARE_PROVIDER_SITE_OTHER): Payer: 59 | Admitting: Family Medicine

## 2013-10-16 VITALS — BP 130/80 | HR 84 | Wt 128.0 lb

## 2013-10-16 DIAGNOSIS — E871 Hypo-osmolality and hyponatremia: Secondary | ICD-10-CM

## 2013-10-16 DIAGNOSIS — B20 Human immunodeficiency virus [HIV] disease: Secondary | ICD-10-CM

## 2013-10-16 DIAGNOSIS — D649 Anemia, unspecified: Secondary | ICD-10-CM

## 2013-10-16 LAB — CBC WITH DIFFERENTIAL/PLATELET
BASOS PCT: 1 % (ref 0–1)
Basophils Absolute: 0 10*3/uL (ref 0.0–0.1)
EOS ABS: 0 10*3/uL (ref 0.0–0.7)
Eosinophils Relative: 0 % (ref 0–5)
HCT: 26.2 % — ABNORMAL LOW (ref 39.0–52.0)
Hemoglobin: 8.7 g/dL — ABNORMAL LOW (ref 13.0–17.0)
Lymphocytes Relative: 15 % (ref 12–46)
Lymphs Abs: 0.7 10*3/uL (ref 0.7–4.0)
MCH: 29.2 pg (ref 26.0–34.0)
MCHC: 33.2 g/dL (ref 30.0–36.0)
MCV: 87.9 fL (ref 78.0–100.0)
Monocytes Absolute: 0.8 10*3/uL (ref 0.1–1.0)
Monocytes Relative: 19 % — ABNORMAL HIGH (ref 3–12)
NEUTROS ABS: 2.9 10*3/uL (ref 1.7–7.7)
NEUTROS PCT: 65 % (ref 43–77)
Platelets: 315 10*3/uL (ref 150–400)
RBC: 2.98 MIL/uL — ABNORMAL LOW (ref 4.22–5.81)
RDW: 17.4 % — ABNORMAL HIGH (ref 11.5–15.5)
WBC: 4.4 10*3/uL (ref 4.0–10.5)

## 2013-10-16 LAB — COMPREHENSIVE METABOLIC PANEL
ALK PHOS: 99 U/L (ref 39–117)
ALT: 66 U/L — AB (ref 0–53)
AST: 49 U/L — ABNORMAL HIGH (ref 0–37)
Albumin: 3 g/dL — ABNORMAL LOW (ref 3.5–5.2)
BILIRUBIN TOTAL: 0.3 mg/dL (ref 0.2–1.2)
BUN: 16 mg/dL (ref 6–23)
CO2: 26 mEq/L (ref 19–32)
Calcium: 8.4 mg/dL (ref 8.4–10.5)
Chloride: 97 mEq/L (ref 96–112)
Creat: 0.73 mg/dL (ref 0.50–1.35)
Glucose, Bld: 105 mg/dL — ABNORMAL HIGH (ref 70–99)
Potassium: 4.7 mEq/L (ref 3.5–5.3)
SODIUM: 129 meq/L — AB (ref 135–145)
TOTAL PROTEIN: 6.2 g/dL (ref 6.0–8.3)

## 2013-10-16 NOTE — Progress Notes (Signed)
   Subjective:    Patient ID: Jeffery Franklin, male    DOB: 1950-08-19, 63 y.o.   MRN: [id number]  HPI He is here for recheck. Last night he did wake up diaphoretic. He has had no fever, chills, cough, congestion. He continues to eat quite well. He states his last bowel movement was March 10 . His bowel habits are usually quite irregular to He has noted a slight morning cough and some nasal congestion no sneezing, itchy watery eyes, rhinorrhea.    Review of Systems     Objective:   Physical Exam alert and in no distress. Tympanic membranes and canals are normal. Throat is clear. Tonsils are normal. Neck is supple without adenopathy or thyromegaly. Cardiac exam shows a regular sinus rhythm without murmurs or gallops. Lungs are clear to auscultation. Skin is quite dry and scaly. Abdominal exam shows no masses or tenderness.        Assessment & Plan:  Acute hyponatremia - Plan: Comprehensive metabolic panel  Human immunodeficiency virus (HIV) disease  Anemia - Plan: CBC with Differential I am still concern over his slow progress. I will wait on the blood work for further evaluation. If there is any question, I will get an ID consult. The weight seems to fluctuate which I think is probably nursing error in writing down the correct weight. This could also be due to different closed and shoes.

## 2013-10-29 ENCOUNTER — Encounter: Payer: Self-pay | Admitting: Family Medicine

## 2013-10-29 ENCOUNTER — Ambulatory Visit (INDEPENDENT_AMBULATORY_CARE_PROVIDER_SITE_OTHER): Payer: 59 | Admitting: Family Medicine

## 2013-10-29 VITALS — BP 160/82 | HR 72 | Wt 132.0 lb

## 2013-10-29 DIAGNOSIS — Z21 Asymptomatic human immunodeficiency virus [HIV] infection status: Secondary | ICD-10-CM

## 2013-10-29 DIAGNOSIS — E43 Unspecified severe protein-calorie malnutrition: Secondary | ICD-10-CM

## 2013-10-29 DIAGNOSIS — Z79899 Other long term (current) drug therapy: Secondary | ICD-10-CM

## 2013-10-29 HISTORY — PX: HERNIA REPAIR: SHX51

## 2013-10-29 LAB — CBC WITH DIFFERENTIAL/PLATELET
Basophils Absolute: 0.1 10*3/uL (ref 0.0–0.1)
Basophils Relative: 2 % — ABNORMAL HIGH (ref 0–1)
Eosinophils Absolute: 0.1 10*3/uL (ref 0.0–0.7)
Eosinophils Relative: 1 % (ref 0–5)
HCT: 32.2 % — ABNORMAL LOW (ref 39.0–52.0)
Hemoglobin: 10.7 g/dL — ABNORMAL LOW (ref 13.0–17.0)
Lymphocytes Relative: 30 % (ref 12–46)
Lymphs Abs: 1.7 10*3/uL (ref 0.7–4.0)
MCH: 31.1 pg (ref 26.0–34.0)
MCHC: 33.2 g/dL (ref 30.0–36.0)
MCV: 93.6 fL (ref 78.0–100.0)
Monocytes Absolute: 0.6 10*3/uL (ref 0.1–1.0)
Monocytes Relative: 10 % (ref 3–12)
Neutro Abs: 3.2 10*3/uL (ref 1.7–7.7)
Neutrophils Relative %: 57 % (ref 43–77)
Platelets: 256 10*3/uL (ref 150–400)
RBC: 3.44 MIL/uL — ABNORMAL LOW (ref 4.22–5.81)
RDW: 19.8 % — ABNORMAL HIGH (ref 11.5–15.5)
WBC: 5.6 10*3/uL (ref 4.0–10.5)

## 2013-10-29 LAB — COMPREHENSIVE METABOLIC PANEL
ALT: 30 U/L (ref 0–53)
AST: 31 U/L (ref 0–37)
Albumin: 3.6 g/dL (ref 3.5–5.2)
Alkaline Phosphatase: 86 U/L (ref 39–117)
BILIRUBIN TOTAL: 0.3 mg/dL (ref 0.2–1.2)
BUN: 14 mg/dL (ref 6–23)
CO2: 26 meq/L (ref 19–32)
Calcium: 8.5 mg/dL (ref 8.4–10.5)
Chloride: 101 mEq/L (ref 96–112)
Creat: 0.86 mg/dL (ref 0.50–1.35)
Glucose, Bld: 111 mg/dL — ABNORMAL HIGH (ref 70–99)
Potassium: 4.2 mEq/L (ref 3.5–5.3)
Sodium: 135 mEq/L (ref 135–145)
Total Protein: 6.9 g/dL (ref 6.0–8.3)

## 2013-10-29 MED ORDER — SULFAMETHOXAZOLE-TMP DS 800-160 MG PO TABS: 1.0000 | ORAL_TABLET | Freq: Every day | ORAL | Status: DC

## 2013-10-29 NOTE — Progress Notes (Signed)
   Subjective:    Patient ID: Jeffery Franklin, male    DOB: 12-13-1950, 63 y.o.   MRN: [id number]  HPI He is here for recheck. His weight is up. He has more energy, stamina and strength. His eating habits are now back to normal. He has been on [place] for almost a full month. He also continues on Septra.  Review of Systems     Objective:   Physical Exam Alert and in no distress. Weight is recorded.       Assessment & Plan:  HIV positive - Plan: sulfamethoxazole-trimethoprim (BACTRIM DS) 800-160 MG per tablet, CBC with Differential, Comprehensive metabolic panel, HIV 1 RNA quant-no reflex-bld  Protein-calorie malnutrition, severe  Encounter for long-term (current) use of other medications - Plan: sulfamethoxazole-trimethoprim (BACTRIM DS) 800-160 MG per tablet, CBC with Differential, Comprehensive metabolic panel, HIV 1 RNA quant-no reflex-bld

## 2013-10-30 LAB — HIV-1 RNA QUANT-NO REFLEX-BLD
HIV 1 RNA QUANT: 374 {copies}/mL — AB (ref <20)
HIV-1 RNA Quant, Log: 2.57 {Log} — ABNORMAL HIGH (ref <1.30)

## 2013-10-31 ENCOUNTER — Encounter: Payer: Self-pay | Admitting: Family Medicine

## 2013-11-03 ENCOUNTER — Telehealth: Payer: Self-pay | Admitting: Family Medicine

## 2013-11-03 NOTE — Telephone Encounter (Signed)
Pt called and stated he was unsure of when he should be seen again. I didn't see a specific note as to when that would be. Please advise pt as to when you would like to see him.

## 2013-11-03 NOTE — Telephone Encounter (Signed)
Per jcl he needs him back in 2-3 months   Pt scheduled an appt for Monday June 8th

## 2013-11-07 ENCOUNTER — Encounter: Payer: Self-pay | Admitting: Family Medicine

## 2013-11-21 ENCOUNTER — Other Ambulatory Visit (INDEPENDENT_AMBULATORY_CARE_PROVIDER_SITE_OTHER): Payer: Self-pay

## 2013-11-21 DIAGNOSIS — K4020 Bilateral inguinal hernia, without obstruction or gangrene, not specified as recurrent: Secondary | ICD-10-CM

## 2013-11-21 MED ORDER — TRAMADOL HCL 50 MG PO TABS: 50.0000 mg | ORAL_TABLET | Freq: Four times a day (QID) | ORAL | Status: DC | PRN

## 2013-12-15 ENCOUNTER — Ambulatory Visit (INDEPENDENT_AMBULATORY_CARE_PROVIDER_SITE_OTHER): Payer: 59 | Admitting: Surgery

## 2013-12-15 ENCOUNTER — Encounter (INDEPENDENT_AMBULATORY_CARE_PROVIDER_SITE_OTHER): Payer: Self-pay | Admitting: Surgery

## 2013-12-15 VITALS — BP 142/82 | HR 78 | Temp 97.9°F | Resp 12 | Ht 70.0 in | Wt 143.2 lb

## 2013-12-15 DIAGNOSIS — K4020 Bilateral inguinal hernia, without obstruction or gangrene, not specified as recurrent: Secondary | ICD-10-CM

## 2013-12-15 NOTE — Progress Notes (Signed)
Subjective:     Patient ID: LEEVON Franklin, male   DOB: 09-16-1950, 63 y.o.   MRN: [id number]  HPI  Note: This dictation was prepared with Dragon/digital dictation along with [facility] technology. Any transcriptional errors that result from this process are unintentional.       Jeffery Franklin  May 18, 1951 [id number]  Patient Care Team: Jeffery Lung, MD as PCP - General (Family Medicine) Jeffery Artist, MD as Consulting Physician (Gastroenterology) Jeffery Hector, MD as Consulting Physician (General Surgery)  Procedure (Date: 11/21/2013):  Diagnostic laparoscopy with laparoscopic bilateral inguinal hernia repair  This patient returns for surgical re-evaluation.  He is feeling well.  Had a little difficulty with urination the first night but now urinating fine.  No fevers or chills.  Needed only a few doses of tramadol.  Back to regular activity.  Working out.  In good spirits.  Patient Active Problem List   Diagnosis Date Noted  . Protein-calorie malnutrition, severe 10/01/2013  . Orthostasis: by pulse 09/30/2013  . Pancytopenia 09/30/2013  . Acute hyponatremia 09/30/2013  . Pulmonary infiltrates assoc with HIV Pos status (new dx 08/17/13) 09/17/2013  . HIV positive 09/17/2013  . Former smoker 09/04/2013  . Anemia 09/04/2013  . Bilateral inguinal hernia (BIH) s/p lap repair 11/21/2013 08/13/2013  . HTN (hypertension) 10/18/2012  . OSTEOPOROSIS 03/26/2007    Past Medical History  Diagnosis Date  . Osteoporosis     osteopenia in past  . Hypertension   . Hyperlipidemia   . Basal cell carcinoma     right temple/ 2013  . Anemia   . HIV (human immunodeficiency virus infection)     Past Surgical History  Procedure Laterality Date  . Colonoscopy    . Skin cancer removed      right temple  . Hernia repair  10/2013    Bilat inguinal hernia    History   Social History  . Marital Status: Married    Spouse Name: N/A    Number of Children: 0  . Years of Education:  N/A   Occupational History  . public relations    Social History Main Topics  . Smoking status: Former Smoker -- 0.50 packs/day for 20 years    Types: Cigarettes    Quit date: 07/27/2013  . Smokeless tobacco: Never Used  . Alcohol Use: 8.4 oz/week    14 Glasses of wine per week     Comment: 1-2 glasses of red wine daily  . Drug Use: No  . Sexual Activity: Yes   Other Topics Concern  . Not on file   Social History Narrative  . No narrative on file    Family History  Problem Relation Age of Onset  . Hypertension Brother   . Heart disease Mother   . Heart disease Father   . Diabetes Father   . Prostate cancer Father   . Diabetes Sister   . Heart disease Brother   . Prostate cancer Brother   . Colon cancer Neg Hx   . Esophageal cancer Neg Hx   . Rectal cancer Neg Hx   . Stomach cancer Neg Hx     Current Outpatient Prescriptions  Medication Sig Dispense Refill  . elvitegravir-cobicistat-emtricitabine-tenofovir (STRIBILD) 150-150-200-300 MG TABS tablet Take 1 tablet by mouth daily with breakfast.  30 tablet  5  . feeding supplement, ENSURE COMPLETE, (ENSURE COMPLETE) LIQD Take 237 mLs by mouth 2 (two) times daily between meals.  60 Bottle  3  . Multiple Vitamins-Iron (  MULTIVITAMIN/IRON PO) Take 1 tablet by mouth daily.      Marland Kitchen omeprazole (PRILOSEC) 40 MG capsule Take 40 mg by mouth 2 (two) times daily. Take 30- 60 min before your first and last meals of the day      . sulfamethoxazole-trimethoprim (BACTRIM DS) 800-160 MG per tablet Take 1 tablet by mouth daily.  90 tablet  3  . telmisartan (MICARDIS) 40 MG tablet        No current facility-administered medications for this visit.     No Known Allergies  BP 142/82  Pulse 78  Temp(Src) 97.9 F (36.6 C) (Temporal)  Resp 12  Ht 5\' 10"  (1.778 m)  Wt 143 lb 3.2 oz (64.955 kg)  BMI 20.55 kg/m2  No results found.   Review of Systems  Constitutional: Negative for fever, chills and diaphoresis.  HENT: Negative for  sore throat and trouble swallowing.   Eyes: Negative for photophobia and visual disturbance.  Respiratory: Negative for choking and shortness of breath.   Cardiovascular: Negative for chest pain and palpitations.  Gastrointestinal: Negative for nausea, vomiting, abdominal distention, anal bleeding and rectal pain.  Genitourinary: Negative for dysuria, urgency, penile swelling, scrotal swelling and testicular pain.  Musculoskeletal: Negative for arthralgias, gait problem, myalgias and neck pain.  Skin: Negative for color change and rash.  Neurological: Negative for dizziness, speech difficulty, weakness and numbness.  Hematological: Negative for adenopathy.  Psychiatric/Behavioral: Negative for hallucinations, confusion and agitation.       Objective:   Physical Exam  Constitutional: He is oriented to person, place, and time. He appears well-developed and well-nourished. No distress.  HENT:  Head: Normocephalic.  Mouth/Throat: Oropharynx is clear and moist. No oropharyngeal exudate.  Eyes: Conjunctivae and EOM are normal. Pupils are equal, round, and reactive to light. No scleral icterus.  Neck: Normal range of motion. No tracheal deviation present.  Cardiovascular: Normal rate, normal heart sounds and intact distal pulses.   Pulmonary/Chest: Effort normal. No respiratory distress.  Abdominal: Soft. He exhibits no distension. There is no tenderness. Hernia confirmed negative in the right inguinal area and confirmed negative in the left inguinal area.  Incisions clean with normal healing ridges.  No hernias  Musculoskeletal: Normal range of motion. He exhibits no tenderness.  Neurological: He is alert and oriented to person, place, and time. No cranial nerve deficit. He exhibits normal muscle tone. Coordination normal.  Skin: Skin is warm and dry. No rash noted. He is not diaphoretic.  Psychiatric: He has a normal mood and affect. His behavior is normal.       Assessment:      Recovering well 3 weeks status post laparoscopic repair of bilateral inguinal hernias.    Plan:     Increase activity as tolerated to regular activity.  Low impact exercise such as walking an hour a day at least ideal.  Do not push through pain.  Diet as tolerated.  Low fat high fiber diet ideal.  Bowel regimen with 30 g fiber a day and fiber supplement as needed to avoid problems.  Return to clinic as needed.   Instructions discussed.  Followup with primary care physician for other health issues as would normally be done.  Consider screening for malignancies (breast, prostate, colon, melanoma, etc) as appropriate.  Questions answered.  The patient expressed understanding and appreciation

## 2013-12-15 NOTE — Patient Instructions (Signed)
HERNIA REPAIR: POST OP INSTRUCTIONS  1. DIET: Follow a light bland diet the first 24 hours after arrival home, such as soup, liquids, crackers, etc.  Be sure to include lots of fluids daily.  Avoid fast food or heavy meals as your are more likely to get nauseated.  Eat a low fat the next few days after surgery. 2. Take your usually prescribed home medications unless otherwise directed. 3. PAIN CONTROL: a. Pain is best controlled by a usual combination of three different methods TOGETHER: i. Ice/Heat ii. Over the counter pain medication iii. Prescription pain medication b. Most patients will experience some swelling and bruising around the hernia(s) such as the bellybutton, groins, or old incisions.  Ice packs or heating pads (30-60 minutes up to 6 times a day) will help. Use ice for the first few days to help decrease swelling and bruising, then switch to heat to help relax tight/sore spots and speed recovery.  Some people prefer to use ice alone, heat alone, alternating between ice & heat.  Experiment to what works for you.  Swelling and bruising can take several weeks to resolve.   c. It is helpful to take an over-the-counter pain medication regularly for the first few weeks.  Choose one of the following that works best for you: i. Naproxen (Aleve, etc)  Two 220mg tabs twice a day ii. Ibuprofen (Advil, etc) Three 200mg tabs four times a day (every meal & bedtime) iii. Acetaminophen (Tylenol, etc) 325-650mg four times a day (every meal & bedtime) d. A  prescription for pain medication should be given to you upon discharge.  Take your pain medication as prescribed.  i. If you are having problems/concerns with the prescription medicine (does not control pain, nausea, vomiting, rash, itching, etc), please call us [phone] to see if we need to switch you to a different pain medicine that will work better for you and/or control your side effect better. ii. If you need a refill on your pain  medication, please contact your pharmacy.  They will contact our office to request authorization. Prescriptions will not be filled after 5 pm or on week-ends. 4. Avoid getting constipated.  Between the surgery and the pain medications, it is common to experience some constipation.  Increasing fluid intake and taking a fiber supplement (such as Metamucil, Citrucel, FiberCon, MiraLax, etc) 1-2 times a day regularly will usually help prevent this problem from occurring.  A mild laxative (prune juice, Milk of Magnesia, MiraLax, etc) should be taken according to package directions if there are no bowel movements after 48 hours.   5. Wash / shower every day.  You may shower over the dressings as they are waterproof.   6. Remove your waterproof bandages 5 days after surgery.  You may leave the incision open to air.  You may replace a dressing/Band-Aid to cover the incision for comfort if you wish.  Continue to shower over incision(s) after the dressing is off.    7. ACTIVITIES as tolerated:   a. You may resume regular (light) daily activities beginning the next day-such as daily self-care, walking, climbing stairs-gradually increasing activities as tolerated.  If you can walk 30 minutes without difficulty, it is safe to try more intense activity such as jogging, treadmill, bicycling, low-impact aerobics, swimming, etc. b. Save the most intensive and strenuous activity for last such as sit-ups, heavy lifting, contact sports, etc  Refrain from any heavy lifting or straining until you are off narcotics for pain control.     c. DO NOT PUSH THROUGH PAIN.  Let pain be your guide: If it hurts to do something, don't do it.  Pain is your body warning you to avoid that activity for another week until the pain goes down. d. You may drive when you are no longer taking prescription pain medication, you can comfortably wear a seatbelt, and you can safely maneuver your car and apply brakes. e. You may have sexual intercourse  when it is comfortable.  8. FOLLOW UP in our office a. Please call [facility] at [phone] to set up an appointment to see your surgeon in the office for a follow-up appointment approximately 2-3 weeks after your surgery. b. Make sure that you call for this appointment the day you arrive home to insure a convenient appointment time. 9.  IF YOU HAVE DISABILITY OR FAMILY LEAVE FORMS, BRING THEM TO THE OFFICE FOR PROCESSING.  DO NOT GIVE THEM TO YOUR DOCTOR.  WHEN TO CALL US [phone]: 1. Poor pain control 2. Reactions / problems with new medications (rash/itching, nausea, etc)  3. Fever over 101.5 F (38.5 C) 4. Inability to urinate 5. Nausea and/or vomiting 6. Worsening swelling or bruising 7. Continued bleeding from incision. 8. Increased pain, redness, or drainage from the incision   The clinic staff is available to answer your questions during regular business hours (8:30am-5pm).  Please don't hesitate to call and ask to speak to one of our nurses for clinical concerns.   If you have a medical emergency, go to the nearest emergency room or call 911.  A surgeon from [facility] is always on call at the hospitals in [place]  [facility] [address], [address], [place], [place]  [address] ?  [address] [address], [place], [place]   [address] MAIN: [phone] ? TOLL FREE: [phone] ? FAX: [phone] [email or web address]  Managing Pain  Pain after surgery or related to activity is often due to strain/injury to muscle, tendon, nerves and/or incisions.  This pain is usually short-term and will improve in a few months.   Many people find it helpful to do the following things TOGETHER to help speed the process of healing and to get back to regular activity more quickly:  1. Avoid heavy physical activity a.  no lifting greater than 20 pounds b. Do not "push through" the pain.  Listen to your body and avoid positions and maneuvers than  reproduce the pain c. Walking is okay as tolerated, but go slowly and stop when getting sore.  d. Remember: If it hurts to do it, then don't do it! 2. Take Anti-inflammatory medication  a. Take with food/snack around the clock for 1-2 weeks i. This helps the muscle and nerve tissues become less irritable and calm down faster b. Choose ONE of the following over-the-counter medications: i. Naproxen 220mg tabs (ex. Aleve) 1-2 pills twice a day  ii. Ibuprofen 200mg tabs (ex. Advil, Motrin) 3-4 pills with every meal and just before bedtime iii. Acetaminophen 500mg tabs (Tylenol) 1-2 pills with every meal and just before bedtime 3. Use a Heating pad or Ice/Cold Pack a. 4-6 times a day b. May use warm bath/hottub  or showers 4. Try Gentle Massage and/or Stretching  a. at the area of pain many times a day b. stop if you feel pain - do not overdo it  Try these steps together to help you body heal faster and avoid making things get worse.  Doing just one of these   things may not be enough.    If you are not getting better after two weeks or are noticing you are getting worse, contact our office for further advice; we may need to re-evaluate you & see what other things we can do to help.   

## 2013-12-30 ENCOUNTER — Encounter: Payer: Self-pay | Admitting: Family Medicine

## 2014-01-05 ENCOUNTER — Encounter: Payer: Self-pay | Admitting: Family Medicine

## 2014-01-05 ENCOUNTER — Ambulatory Visit (INDEPENDENT_AMBULATORY_CARE_PROVIDER_SITE_OTHER): Payer: 59 | Admitting: Family Medicine

## 2014-01-05 VITALS — BP 120/72 | Wt 148.0 lb

## 2014-01-05 DIAGNOSIS — B20 Human immunodeficiency virus [HIV] disease: Secondary | ICD-10-CM

## 2014-01-05 DIAGNOSIS — Z79899 Other long term (current) drug therapy: Secondary | ICD-10-CM

## 2014-01-05 NOTE — Progress Notes (Signed)
   Subjective:    Patient ID: Jeffery Franklin, male    DOB: 01-10-1951, 63 y.o.   MRN: [id number]  HPI He is here for recheck. He is having no difficulty with his medication specifically fever, chills, nausea, vomiting, early satiety. His weight is up to his normal. Last viral load was 374. He has no particular other concerns or complaints. He recently did get married.  Review of Systems     Objective:   Physical Exam alert and in no distress. Tympanic membranes and canals are normal. Throat is clear. Tonsils are normal. Neck is supple without adenopathy or thyromegaly. Cardiac exam shows a regular sinus rhythm without murmurs or gallops. Lungs are clear to auscultation. Abdominal exam shows no masses or tenderness. No inguinal or axillary adenopathy is noted.       Assessment & Plan:  Human immunodeficiency virus (HIV) disease - Plan: HIV 1 RNA quant-no reflex-bld, T-helper cells (CD4) count  Encounter for long-term (current) use of other medications - Plan: HIV 1 RNA quant-no reflex-bld, T-helper cells (CD4) count

## 2014-01-06 LAB — T-HELPER CELLS (CD4) COUNT (NOT AT ARMC)
Absolute CD4: 509 /uL (ref 381–1469)
CD4 T HELPER %: 27 % — AB (ref 32–62)
Total Lymphocyte: 41 % (ref 12–46)
Total lymphocyte count: 1886 /uL (ref 700–3300)
WBC, lymph enumeration: 4.6 10*3/uL (ref 4.0–10.5)

## 2014-01-07 LAB — HIV-1 RNA QUANT-NO REFLEX-BLD
HIV 1 RNA Quant: 85 copies/mL — ABNORMAL HIGH (ref <20)
HIV-1 RNA Quant, Log: 1.93 {Log} — ABNORMAL HIGH (ref <1.30)

## 2014-01-12 ENCOUNTER — Encounter: Payer: Self-pay | Admitting: Family Medicine

## 2014-02-09 ENCOUNTER — Encounter: Payer: Self-pay | Admitting: Family Medicine

## 2014-02-18 ENCOUNTER — Other Ambulatory Visit: Payer: Self-pay | Admitting: Internal Medicine

## 2014-03-18 ENCOUNTER — Telehealth: Payer: Self-pay | Admitting: Family Medicine

## 2014-03-18 DIAGNOSIS — B20 Human immunodeficiency virus [HIV] disease: Secondary | ICD-10-CM

## 2014-03-18 DIAGNOSIS — R918 Other nonspecific abnormal finding of lung field: Secondary | ICD-10-CM

## 2014-03-18 MED ORDER — ELVITEG-COBIC-EMTRICIT-TENOFDF 150-150-200-300 MG PO TABS: 1.0000 | ORAL_TABLET | Freq: Every day | ORAL | Status: DC

## 2014-03-18 NOTE — Telephone Encounter (Signed)
[facility] [address]  Refill request for Stribild Tabs

## 2014-03-20 ENCOUNTER — Telehealth: Payer: Self-pay | Admitting: Family Medicine

## 2014-03-20 ENCOUNTER — Telehealth: Payer: Self-pay | Admitting: Internal Medicine

## 2014-03-20 DIAGNOSIS — R918 Other nonspecific abnormal finding of lung field: Secondary | ICD-10-CM

## 2014-03-20 DIAGNOSIS — B20 Human immunodeficiency virus [HIV] disease: Secondary | ICD-10-CM

## 2014-03-20 MED ORDER — ELVITEG-COBIC-EMTRICIT-TENOFDF 150-150-200-300 MG PO TABS: 1.0000 | ORAL_TABLET | Freq: Every day | ORAL | Status: DC

## 2014-03-20 NOTE — Telephone Encounter (Signed)
Called [facility] and explained error, s/w [place] & they state the medication hasn't been shipped yet and pt has not been billed.  They are trying to stop the shipment and states more than likely can get it stopped but can't guarantee.  Called and left message for pt.

## 2014-03-20 NOTE — Telephone Encounter (Signed)
Refill request for stribild send to [facility] [address]

## 2014-03-20 NOTE — Telephone Encounter (Signed)
See what you can do to help

## 2014-03-20 NOTE — Telephone Encounter (Signed)
Pt called stating that he got an email from [facility] stating that his HIV med was being shipped. Pt states that he does NOT get his meds from [facility] and he has tried to call and cancel it however if they have already got through the process and in the process of mailing it, pt will be charged $140 for this medicine and he is NOT happy. Pt will keep calling [facility] to see what the status is. But [facility] which is the [place].

## 2014-03-23 ENCOUNTER — Telehealth: Payer: Self-pay | Admitting: Family Medicine

## 2014-03-23 NOTE — Telephone Encounter (Signed)
Pt left message that he was able to reach the mail order within 24 hours of shipment, so they have agreed to credit him and he will get meds at [facility].

## 2014-03-30 ENCOUNTER — Encounter: Payer: Self-pay | Admitting: Family Medicine

## 2014-03-30 ENCOUNTER — Ambulatory Visit (INDEPENDENT_AMBULATORY_CARE_PROVIDER_SITE_OTHER): Payer: 59 | Admitting: Family Medicine

## 2014-03-30 VITALS — BP 120/82 | HR 60 | Wt 152.0 lb

## 2014-03-30 DIAGNOSIS — B20 Human immunodeficiency virus [HIV] disease: Secondary | ICD-10-CM

## 2014-03-30 DIAGNOSIS — Z23 Encounter for immunization: Secondary | ICD-10-CM

## 2014-03-30 DIAGNOSIS — Z79899 Other long term (current) drug therapy: Secondary | ICD-10-CM

## 2014-03-30 DIAGNOSIS — I10 Essential (primary) hypertension: Secondary | ICD-10-CM

## 2014-03-30 MED ORDER — TELMISARTAN 40 MG PO TABS: ORAL_TABLET | ORAL | Status: DC

## 2014-03-30 NOTE — Progress Notes (Signed)
   Subjective:    Patient ID: Jeffery Franklin, male    DOB: 1951-06-22, 63 y.o.   MRN: [id number]  HPI He is here for recheck. Continues on his blood pressure medication and is having no difficulty with this. He also continues on [place] and his having no symptoms. The medical record was reviewed. The CD4 count did look quite good. Review of Systems     Objective:   Physical Exam Alert and in no distress otherwise not examined       Assessment & Plan:  Need for prophylactic vaccination and inoculation against influenza - Plan: Flu Vaccine QUAD 36+ mos IM  Need for prophylactic vaccination against Streptococcus pneumoniae (pneumococcus) - Plan: Pneumococcal conjugate vaccine 13-valent  Essential hypertension  AIDS (acquired immunodeficiency syndrome), CD4 <=200 - Plan: CBC with Differential, Comprehensive metabolic panel, HIV 1 RNA quant-no reflex-bld, T-helper cells (CD4) count, CANCELED: T-helper cells (CD4) count  Encounter for long-term (current) use of other medications - Plan: CBC with Differential, Comprehensive metabolic panel, HIV 1 RNA quant-no reflex-bld, CANCELED: T-helper cells (CD4) count  if his CD4 count remains good, I will probably stop his PCP prophylaxis.

## 2014-03-31 LAB — COMPREHENSIVE METABOLIC PANEL
ALT: 16 U/L (ref 0–53)
AST: 21 U/L (ref 0–37)
Albumin: 4.3 g/dL (ref 3.5–5.2)
Alkaline Phosphatase: 64 U/L (ref 39–117)
BILIRUBIN TOTAL: 0.6 mg/dL (ref 0.2–1.2)
BUN: 14 mg/dL (ref 6–23)
CO2: 30 mEq/L (ref 19–32)
CREATININE: 0.91 mg/dL (ref 0.50–1.35)
Calcium: 9.1 mg/dL (ref 8.4–10.5)
Chloride: 101 mEq/L (ref 96–112)
GLUCOSE: 99 mg/dL (ref 70–99)
Potassium: 4.2 mEq/L (ref 3.5–5.3)
Sodium: 137 mEq/L (ref 135–145)
Total Protein: 7.1 g/dL (ref 6.0–8.3)

## 2014-03-31 LAB — T-HELPER CELLS (CD4) COUNT (NOT AT ARMC)
Absolute CD4: 483 /uL (ref 381–1469)
CD4 T Helper %: 29 % — ABNORMAL LOW (ref 32–62)
TOTAL LYMPHOCYTE COUNT: 1666 /uL (ref 700–3300)
Total Lymphocyte: 34 % (ref 12–46)
WBC, LYMPH ENUMERATION: 4.9 10*3/uL (ref 4.0–10.5)

## 2014-03-31 LAB — CBC WITH DIFFERENTIAL/PLATELET
BASOS PCT: 0 % (ref 0–1)
Basophils Absolute: 0 10*3/uL (ref 0.0–0.1)
EOS PCT: 2 % (ref 0–5)
Eosinophils Absolute: 0.1 10*3/uL (ref 0.0–0.7)
HEMATOCRIT: 36.8 % — AB (ref 39.0–52.0)
Hemoglobin: 13.1 g/dL (ref 13.0–17.0)
Lymphocytes Relative: 34 % (ref 12–46)
Lymphs Abs: 1.7 10*3/uL (ref 0.7–4.0)
MCH: 33.9 pg (ref 26.0–34.0)
MCHC: 35.6 g/dL (ref 30.0–36.0)
MCV: 95.3 fL (ref 78.0–100.0)
MONO ABS: 0.5 10*3/uL (ref 0.1–1.0)
MONOS PCT: 10 % (ref 3–12)
NEUTROS PCT: 54 % (ref 43–77)
Neutro Abs: 2.6 10*3/uL (ref 1.7–7.7)
Platelets: 256 10*3/uL (ref 150–400)
RBC: 3.86 MIL/uL — ABNORMAL LOW (ref 4.22–5.81)
RDW: 14.2 % (ref 11.5–15.5)
WBC: 4.9 10*3/uL (ref 4.0–10.5)

## 2014-04-01 LAB — HIV-1 RNA QUANT-NO REFLEX-BLD

## 2014-04-14 ENCOUNTER — Encounter: Payer: Self-pay | Admitting: Family Medicine

## 2014-06-22 ENCOUNTER — Other Ambulatory Visit: Payer: Self-pay | Admitting: Dermatology

## 2014-08-31 ENCOUNTER — Encounter: Payer: Self-pay | Admitting: Family Medicine

## 2014-08-31 ENCOUNTER — Ambulatory Visit (INDEPENDENT_AMBULATORY_CARE_PROVIDER_SITE_OTHER): Payer: 59 | Admitting: Family Medicine

## 2014-08-31 VITALS — BP 120/80 | HR 58 | Wt 153.0 lb

## 2014-08-31 DIAGNOSIS — B20 Human immunodeficiency virus [HIV] disease: Secondary | ICD-10-CM

## 2014-08-31 DIAGNOSIS — I10 Essential (primary) hypertension: Secondary | ICD-10-CM

## 2014-08-31 LAB — CBC WITH DIFFERENTIAL/PLATELET
Basophils Absolute: 0 10*3/uL (ref 0.0–0.1)
Basophils Relative: 0 % (ref 0–1)
EOS ABS: 0.1 10*3/uL (ref 0.0–0.7)
EOS PCT: 2 % (ref 0–5)
HCT: 37.7 % — ABNORMAL LOW (ref 39.0–52.0)
Hemoglobin: 13 g/dL (ref 13.0–17.0)
LYMPHS PCT: 36 % (ref 12–46)
Lymphs Abs: 2 10*3/uL (ref 0.7–4.0)
MCH: 33.6 pg (ref 26.0–34.0)
MCHC: 34.5 g/dL (ref 30.0–36.0)
MCV: 97.4 fL (ref 78.0–100.0)
MONOS PCT: 9 % (ref 3–12)
MPV: 8.8 fL (ref 8.6–12.4)
Monocytes Absolute: 0.5 10*3/uL (ref 0.1–1.0)
NEUTROS ABS: 2.9 10*3/uL (ref 1.7–7.7)
Neutrophils Relative %: 53 % (ref 43–77)
Platelets: 247 10*3/uL (ref 150–400)
RBC: 3.87 MIL/uL — ABNORMAL LOW (ref 4.22–5.81)
RDW: 13.6 % (ref 11.5–15.5)
WBC: 5.5 10*3/uL (ref 4.0–10.5)

## 2014-08-31 NOTE — Progress Notes (Signed)
   Subjective:    Patient ID: Jeffery Franklin, male    DOB: 1950-08-27, 63 y.o.   MRN: [id number]  HPI He is here for a follow-up on his underlying HIV. He continues on stride [place] and Bactrim as well as Micardis. He has no particular concerns or complaints. No headache, sore throat, chest congestion, coughing, weight change, nausea or vomiting.'s work and home life are going quite well.   Review of Systems     Objective:   Physical Exam alert and in no distress. Tympanic membranes and canals are normal. Throat is clear. Tonsils are normal. Neck is supple without adenopathy or thyromegaly. Cardiac exam shows a regular sinus rhythm without murmurs or gallops. Lungs are clear to auscultation. Abdominal exam shows no masses or tenderness. No axillary or inguinal adenopathy noted.        Assessment & Plan:  AIDS (acquired immunodeficiency syndrome), CD4 <=200 - Plan: CBC with Differential/Platelet, Comprehensive metabolic panel, Lipid panel, HIV 1 RNA quant-no reflex-bld, T-helper cells (CD4) count  Essential hypertension  he will continue on his present medication regimen. I might possibly stop the Septra if his CD4 count remains stable

## 2014-09-01 LAB — COMPREHENSIVE METABOLIC PANEL
ALK PHOS: 72 U/L (ref 39–117)
ALT: 16 U/L (ref 0–53)
AST: 19 U/L (ref 0–37)
Albumin: 4.4 g/dL (ref 3.5–5.2)
BILIRUBIN TOTAL: 0.7 mg/dL (ref 0.2–1.2)
BUN: 12 mg/dL (ref 6–23)
CHLORIDE: 103 meq/L (ref 96–112)
CO2: 25 mEq/L (ref 19–32)
Calcium: 9.1 mg/dL (ref 8.4–10.5)
Creat: 0.94 mg/dL (ref 0.50–1.35)
GLUCOSE: 87 mg/dL (ref 70–99)
Potassium: 4.2 mEq/L (ref 3.5–5.3)
SODIUM: 137 meq/L (ref 135–145)
TOTAL PROTEIN: 7 g/dL (ref 6.0–8.3)

## 2014-09-01 LAB — LIPID PANEL
CHOL/HDL RATIO: 3.5 ratio
CHOLESTEROL: 177 mg/dL (ref 0–200)
HDL: 51 mg/dL (ref >39)
LDL Cholesterol: 104 mg/dL — ABNORMAL HIGH (ref 0–99)
Triglycerides: 110 mg/dL (ref <150)
VLDL: 22 mg/dL (ref 0–40)

## 2014-09-01 LAB — T-HELPER CELLS (CD4) COUNT (NOT AT ARMC)
ABSOLUTE CD4: 574 /uL (ref 381–1469)
CD4 T HELPER %: 29 % — AB (ref 32–62)
TOTAL LYMPHOCYTE COUNT: 1980 /uL (ref 700–3300)
Total Lymphocyte: 36 % (ref 12–46)
WBC, lymph enumeration: 5.5 10*3/uL (ref 4.0–10.5)

## 2014-09-02 LAB — HIV-1 RNA QUANT-NO REFLEX-BLD
HIV 1 RNA Quant: 38 copies/mL — ABNORMAL HIGH (ref <20)
HIV-1 RNA Quant, Log: 1.58 {Log} — ABNORMAL HIGH (ref <1.30)

## 2014-10-13 ENCOUNTER — Telehealth: Payer: Self-pay | Admitting: Internal Medicine

## 2014-10-13 DIAGNOSIS — R918 Other nonspecific abnormal finding of lung field: Secondary | ICD-10-CM

## 2014-10-13 DIAGNOSIS — B20 Human immunodeficiency virus [HIV] disease: Secondary | ICD-10-CM

## 2014-10-13 MED ORDER — ELVITEG-COBIC-EMTRICIT-TENOFDF 150-150-200-300 MG PO TABS: 1.0000 | ORAL_TABLET | Freq: Every day | ORAL | Status: DC

## 2014-10-13 NOTE — Telephone Encounter (Signed)
Refill request for stribild for [facility] [address]

## 2015-03-01 ENCOUNTER — Encounter: Payer: Self-pay | Admitting: Family Medicine

## 2015-03-01 ENCOUNTER — Ambulatory Visit (INDEPENDENT_AMBULATORY_CARE_PROVIDER_SITE_OTHER): Payer: 59 | Admitting: Family Medicine

## 2015-03-01 VITALS — BP 120/70 | HR 52 | Ht 70.0 in | Wt 155.0 lb

## 2015-03-01 DIAGNOSIS — R918 Other nonspecific abnormal finding of lung field: Secondary | ICD-10-CM

## 2015-03-01 DIAGNOSIS — B20 Human immunodeficiency virus [HIV] disease: Secondary | ICD-10-CM

## 2015-03-01 DIAGNOSIS — Z720 Tobacco use: Secondary | ICD-10-CM | POA: Diagnosis not present

## 2015-03-01 DIAGNOSIS — Z79899 Other long term (current) drug therapy: Secondary | ICD-10-CM | POA: Diagnosis not present

## 2015-03-01 DIAGNOSIS — F17200 Nicotine dependence, unspecified, uncomplicated: Secondary | ICD-10-CM

## 2015-03-01 DIAGNOSIS — I10 Essential (primary) hypertension: Secondary | ICD-10-CM | POA: Diagnosis not present

## 2015-03-01 LAB — COMPREHENSIVE METABOLIC PANEL
ALT: 14 U/L (ref 9–46)
AST: 19 U/L (ref 10–35)
Albumin: 4.3 g/dL (ref 3.6–5.1)
Alkaline Phosphatase: 63 U/L (ref 40–115)
BILIRUBIN TOTAL: 0.6 mg/dL (ref 0.2–1.2)
BUN: 11 mg/dL (ref 7–25)
CHLORIDE: 103 mmol/L (ref 98–110)
CO2: 30 mmol/L (ref 20–31)
Calcium: 9.4 mg/dL (ref 8.6–10.3)
Creat: 1.02 mg/dL (ref 0.70–1.25)
GLUCOSE: 91 mg/dL (ref 65–99)
POTASSIUM: 4.7 mmol/L (ref 3.5–5.3)
Sodium: 139 mmol/L (ref 135–146)
TOTAL PROTEIN: 7 g/dL (ref 6.1–8.1)

## 2015-03-01 LAB — LIPID PANEL
CHOL/HDL RATIO: 4.2 ratio (ref <=5.0)
Cholesterol: 179 mg/dL (ref 125–200)
HDL: 43 mg/dL (ref >=40)
LDL Cholesterol: 109 mg/dL (ref <130)
TRIGLYCERIDES: 135 mg/dL (ref <150)
VLDL: 27 mg/dL (ref <30)

## 2015-03-01 LAB — CBC WITH DIFFERENTIAL/PLATELET
Basophils Absolute: 0 10*3/uL (ref 0.0–0.1)
Basophils Relative: 0 % (ref 0–1)
EOS PCT: 3 % (ref 0–5)
Eosinophils Absolute: 0.2 10*3/uL (ref 0.0–0.7)
HCT: 41 % (ref 39.0–52.0)
HEMOGLOBIN: 14.2 g/dL (ref 13.0–17.0)
Lymphocytes Relative: 29 % (ref 12–46)
Lymphs Abs: 1.7 10*3/uL (ref 0.7–4.0)
MCH: 33.9 pg (ref 26.0–34.0)
MCHC: 34.6 g/dL (ref 30.0–36.0)
MCV: 97.9 fL (ref 78.0–100.0)
MONOS PCT: 7 % (ref 3–12)
MPV: 8.9 fL (ref 8.6–12.4)
Monocytes Absolute: 0.4 10*3/uL (ref 0.1–1.0)
Neutro Abs: 3.6 10*3/uL (ref 1.7–7.7)
Neutrophils Relative %: 61 % (ref 43–77)
Platelets: 260 10*3/uL (ref 150–400)
RBC: 4.19 MIL/uL — ABNORMAL LOW (ref 4.22–5.81)
RDW: 13.5 % (ref 11.5–15.5)
WBC: 5.9 10*3/uL (ref 4.0–10.5)

## 2015-03-01 MED ORDER — ELVITEG-COBIC-EMTRICIT-TENOFDF 150-150-200-300 MG PO TABS: 1.0000 | ORAL_TABLET | Freq: Every day | ORAL | Status: DC

## 2015-03-01 MED ORDER — TELMISARTAN 40 MG PO TABS: ORAL_TABLET | ORAL | Status: DC

## 2015-03-01 NOTE — Progress Notes (Signed)
   Subjective:    Patient ID: Jeffery Franklin, male    DOB: 1951-04-24, 64 y.o.   MRN: [id number]  HPI He is here for medication check. He continues to do quite nicely on his HIV medication as well as Micardis for his blood pressure. He has had no fever, chills, headache, weight change, abdominal issues. He has started smoking again and states that he uses the cigarettes as a reward for getting tasks done.   Review of Systems     Objective:   Physical Exam Alert and in no distress. Tympanic membranes and canals are normal. Pharyngeal area is normal. Neck is supple without adenopathy or thyromegaly. Cardiac exam shows a regular sinus rhythm without murmurs or gallops. Lungs are clear to auscultation. Abdominal exam shows no masses or tenderness. No axillary or inguinal adenopathy.        Assessment & Plan:  Human immunodeficiency virus (HIV) disease - Plan: CBC with Differential/Platelet, Comprehensive metabolic panel, Lipid panel, HIV 1 RNA quant-no reflex-bld, elvitegravir-cobicistat-emtricitabine-tenofovir (STRIBILD) 150-150-200-300 MG TABS tablet, T-helper cells (CD4) count, CANCELED: T-helper cells (CD4) count  Essential hypertension - Plan: CBC with Differential/Platelet, Comprehensive metabolic panel, telmisartan (MICARDIS) 40 MG tablet  Encounter for long-term (current) use of other high-risk medications - Plan: CBC with Differential/Platelet, Comprehensive metabolic panel, Lipid panel, HIV 1 RNA quant-no reflex-bld, CANCELED: T-helper cells (CD4) count  Pulmonary infiltrates assoc with HIV Pos status (new dx 08/17/13) - Plan: elvitegravir-cobicistat-emtricitabine-tenofovir (STRIBILD) 150-150-200-300 MG TABS tablet  Smoker  I discussed his smoking with him. Strongly encouraged him to use some other means of reward for getting his tasks done. Discussed the need to stop smoking especially in regard to his underlying HIV status.

## 2015-03-02 LAB — T-HELPER CELLS (CD4) COUNT (NOT AT ARMC)
Absolute CD4: 496 /uL (ref 381–1469)
CD4 T Helper %: 29 % — ABNORMAL LOW (ref 32–62)
Total Lymphocyte: 29 % (ref 12–46)
Total lymphocyte count: 1711 /uL (ref 700–3300)
WBC, lymph enumeration: 5.9 10*3/uL (ref 4.0–10.5)

## 2015-03-03 LAB — HIV-1 RNA QUANT-NO REFLEX-BLD: HIV-1 RNA Quant, Log: <1.3 {Log} (ref <1.30)

## 2015-03-19 ENCOUNTER — Encounter: Payer: Self-pay | Admitting: Family Medicine

## 2015-03-19 ENCOUNTER — Other Ambulatory Visit: Payer: Self-pay

## 2015-03-19 DIAGNOSIS — I10 Essential (primary) hypertension: Secondary | ICD-10-CM

## 2015-03-19 MED ORDER — TELMISARTAN 40 MG PO TABS: ORAL_TABLET | ORAL | Status: DC

## 2015-04-26 ENCOUNTER — Other Ambulatory Visit: Payer: Self-pay

## 2015-04-27 ENCOUNTER — Other Ambulatory Visit (INDEPENDENT_AMBULATORY_CARE_PROVIDER_SITE_OTHER): Payer: 59

## 2015-04-27 DIAGNOSIS — Z23 Encounter for immunization: Secondary | ICD-10-CM | POA: Diagnosis not present

## 2015-05-11 ENCOUNTER — Encounter: Payer: Self-pay | Admitting: Family Medicine

## 2015-07-21 ENCOUNTER — Other Ambulatory Visit: Payer: Self-pay | Admitting: Family Medicine

## 2015-07-22 MED ORDER — ELVITEG-COBIC-EMTRICIT-TENOFDF 150-150-200-300 MG PO TABS: ORAL_TABLET | ORAL | Status: DC

## 2015-07-22 NOTE — Telephone Encounter (Signed)
done

## 2015-07-22 NOTE — Telephone Encounter (Signed)
Ok to refill 

## 2015-07-22 NOTE — Telephone Encounter (Signed)
Pt insurance won't pay for #90 at a time for this med, it needs to be changed to #30. He says it is about $400 if it is written for 90 days , but if it's for 30 then the cost to him is $0.

## 2015-07-22 NOTE — Addendum Note (Signed)
Addended by: Denita Lung on: 07/22/2015 02:19 PM   Modules accepted: Orders

## 2015-07-23 NOTE — Telephone Encounter (Signed)
It's fixed.

## 2015-08-21 ENCOUNTER — Telehealth: Payer: Self-pay | Admitting: Family Medicine

## 2015-08-25 NOTE — Telephone Encounter (Signed)
JCL completed questionnaire and faxed to [employer]

## 2015-08-31 ENCOUNTER — Encounter: Payer: Self-pay | Admitting: Family Medicine

## 2015-08-31 ENCOUNTER — Ambulatory Visit (INDEPENDENT_AMBULATORY_CARE_PROVIDER_SITE_OTHER): Payer: 59 | Admitting: Family Medicine

## 2015-08-31 VITALS — BP 122/70 | HR 64 | Wt 158.0 lb

## 2015-08-31 DIAGNOSIS — B20 Human immunodeficiency virus [HIV] disease: Secondary | ICD-10-CM

## 2015-08-31 DIAGNOSIS — Z1159 Encounter for screening for other viral diseases: Secondary | ICD-10-CM | POA: Diagnosis not present

## 2015-08-31 DIAGNOSIS — F17200 Nicotine dependence, unspecified, uncomplicated: Secondary | ICD-10-CM

## 2015-08-31 DIAGNOSIS — Z720 Tobacco use: Secondary | ICD-10-CM | POA: Diagnosis not present

## 2015-08-31 DIAGNOSIS — Z8739 Personal history of other diseases of the musculoskeletal system and connective tissue: Secondary | ICD-10-CM | POA: Diagnosis not present

## 2015-08-31 DIAGNOSIS — Z23 Encounter for immunization: Secondary | ICD-10-CM | POA: Diagnosis not present

## 2015-08-31 DIAGNOSIS — I10 Essential (primary) hypertension: Secondary | ICD-10-CM

## 2015-08-31 LAB — CBC WITH DIFFERENTIAL/PLATELET
Basophils Absolute: 0 10*3/uL (ref 0.0–0.1)
Basophils Relative: 0 % (ref 0–1)
EOS ABS: 0.3 10*3/uL (ref 0.0–0.7)
Eosinophils Relative: 4 % (ref 0–5)
HCT: 40.2 % (ref 39.0–52.0)
Hemoglobin: 14.1 g/dL (ref 13.0–17.0)
Lymphocytes Relative: 31 % (ref 12–46)
Lymphs Abs: 2.1 10*3/uL (ref 0.7–4.0)
MCH: 34.6 pg — AB (ref 26.0–34.0)
MCHC: 35.1 g/dL (ref 30.0–36.0)
MCV: 98.5 fL (ref 78.0–100.0)
MONOS PCT: 9 % (ref 3–12)
MPV: 9.1 fL (ref 8.6–12.4)
Monocytes Absolute: 0.6 10*3/uL (ref 0.1–1.0)
NEUTROS PCT: 56 % (ref 43–77)
Neutro Abs: 3.9 10*3/uL (ref 1.7–7.7)
PLATELETS: 263 10*3/uL (ref 150–400)
RBC: 4.08 MIL/uL — ABNORMAL LOW (ref 4.22–5.81)
RDW: 13.8 % (ref 11.5–15.5)
WBC: 6.9 10*3/uL (ref 4.0–10.5)

## 2015-08-31 LAB — COMPREHENSIVE METABOLIC PANEL
ALT: 16 U/L (ref 9–46)
AST: 19 U/L (ref 10–35)
Albumin: 4 g/dL (ref 3.6–5.1)
Alkaline Phosphatase: 70 U/L (ref 40–115)
BUN: 15 mg/dL (ref 7–25)
CHLORIDE: 102 mmol/L (ref 98–110)
CO2: 28 mmol/L (ref 20–31)
CREATININE: 1.12 mg/dL (ref 0.70–1.25)
Calcium: 9.3 mg/dL (ref 8.6–10.3)
Glucose, Bld: 94 mg/dL (ref 65–99)
POTASSIUM: 4.3 mmol/L (ref 3.5–5.3)
SODIUM: 138 mmol/L (ref 135–146)
TOTAL PROTEIN: 7.2 g/dL (ref 6.1–8.1)
Total Bilirubin: 0.7 mg/dL (ref 0.2–1.2)

## 2015-08-31 LAB — LIPID PANEL
CHOLESTEROL: 186 mg/dL (ref 125–200)
HDL: 42 mg/dL (ref >=40)
LDL Cholesterol: 115 mg/dL (ref <130)
TRIGLYCERIDES: 147 mg/dL (ref <150)
Total CHOL/HDL Ratio: 4.4 Ratio (ref <=5.0)
VLDL: 29 mg/dL (ref <30)

## 2015-08-31 MED ORDER — ELVITEG-COBIC-EMTRICIT-TENOFDF 150-150-200-300 MG PO TABS: ORAL_TABLET | ORAL | Status: DC

## 2015-08-31 NOTE — Progress Notes (Signed)
Gave information to patient to call [facility] to schedule [facility] D[facility]

## 2015-08-31 NOTE — Progress Notes (Signed)
   Subjective:    Patient ID: Jeffery Franklin, male    DOB: 1950-10-27, 65 y.o.   MRN: [id number]  HPI He is here for a follow-up visit.He continues on STRIBILD and is doing quite well on that. His had no fever, chills, GI issues and in fact is actually gained a little bit of weight. He continues on his blood pressure medication. He is now not smoking but vaping. On further discussion with him he is doing this every hour essentially to have a break in his work schedule. Review of his record indicates he has a remote history of possible osteoporosis. He relates being on daily dose of Fosamax and was also given nasal calcitonin at one point when he had difficulty dealing with taking Fosamax on a daily basis. He thinks his last DEXA scan was approximately 2 years ago. Otherwise he has no particular concerns or complaints.  Review of Systems     Objective:   Physical Exam Alert and in no distress. Tympanic membranes and canals are normal. Pharyngeal area is normal. Neck is supple without adenopathy or thyromegaly. Cardiac exam shows a regular sinus rhythm without murmurs or gallops. Lungs are clear to auscultation.No axillary or inguinal adenopathy is noted.        Assessment & Plan:  AIDS (acquired immunodeficiency syndrome), CD4 <=200 ([place]) - Plan: elvitegravir-cobicistat-emtricitabine-tenofovir (STRIBILD) 150-150-200-300 MG TABS tablet, CBC with Differential/Platelet, Comprehensive metabolic panel, HIV 1 RNA quant-no reflex-bld, T-helper cells (CD4) count (not at [facility]), Lipid panel  Essential hypertension - Plan: CBC with Differential/Platelet, Comprehensive metabolic panel, Lipid panel  Smoker  Need for hepatitis C screening test - Plan: Hepatitis C antibody  Need for prophylactic vaccination against Streptococcus pneumoniae (pneumococcus) - Plan: Pneumococcal polysaccharide vaccine 23-valent greater than or equal to 2yo subcutaneous/IM  History of osteoporosis - Plan: DG Bone Density He  will continue on his present medications. I discussed his vaping only encouraged him to continue to spread out the use of this particular device. It does not sound like he's truly addicted to nicotine at this point. Also follow-up concerning his osteoporosis or at least a history thereof.

## 2015-09-01 LAB — T-HELPER CELLS (CD4) COUNT (NOT AT ARMC)
Absolute CD4: 706 cells/uL (ref 381–1469)
CD4 T HELPER %: 30 % — AB (ref 32–62)
TOTAL LYMPHOCYTE COUNT: 2315 {cells}/uL (ref 700–3300)

## 2015-09-01 LAB — HEPATITIS C ANTIBODY: HCV Ab: NEGATIVE

## 2015-09-02 LAB — HIV-1 RNA QUANT-NO REFLEX-BLD
HIV 1 RNA QUANT: 58 {copies}/mL — AB (ref <20)
HIV-1 RNA Quant, Log: 1.76 Log copies/mL — ABNORMAL HIGH (ref <1.30)

## 2015-09-07 NOTE — Telephone Encounter (Signed)
Per [facility] medication is on plan's list of covered drugs.  Called pharmacy & went thru with coupon for $0 Pt informed

## 2015-09-28 ENCOUNTER — Ambulatory Visit
Admission: RE | Admit: 2015-09-28 | Discharge: 2015-09-28 | Disposition: A | Discharge status: Home / Self Care | Payer: 59 | Source: Ambulatory Visit | Attending: Family Medicine | Admitting: Family Medicine

## 2015-09-28 DIAGNOSIS — Z8739 Personal history of other diseases of the musculoskeletal system and connective tissue: Secondary | ICD-10-CM

## 2015-10-11 ENCOUNTER — Telehealth: Payer: Self-pay | Admitting: Family Medicine

## 2015-10-11 NOTE — Telephone Encounter (Signed)
Pt states [employer] stating [employer] requiring P.A.  I advised will call

## 2015-10-13 NOTE — Telephone Encounter (Signed)
P.A. Holli Humbles approved til 10/12/16, called pharmacy & went thru for $0, pt informed

## 2015-10-13 NOTE — Telephone Encounter (Signed)
Issue is I completed a P.A. in January, the faxed response said no P.A required and went thru pharmacy January & February.  Now rejecting in March.  I called [facility] t# [id number] and after 55 minutes of being transferred to several different people, I spoke with Supervisor Raquel Sarna who spoke with P.A. [employer].  Verdict is needs P.A. Is due to cost of medication is over $1500, and the plan has changed since January.  New P.A. # [id number]. I Left message for pt.

## 2015-12-08 ENCOUNTER — Ambulatory Visit (INDEPENDENT_AMBULATORY_CARE_PROVIDER_SITE_OTHER): Payer: 59 | Admitting: Family Medicine

## 2015-12-08 ENCOUNTER — Other Ambulatory Visit: Payer: Self-pay | Admitting: Family Medicine

## 2015-12-08 ENCOUNTER — Encounter: Payer: Self-pay | Admitting: Family Medicine

## 2015-12-08 VITALS — BP 120/70 | HR 60 | Ht 70.0 in | Wt 156.0 lb

## 2015-12-08 DIAGNOSIS — B20 Human immunodeficiency virus [HIV] disease: Secondary | ICD-10-CM

## 2015-12-08 DIAGNOSIS — F17200 Nicotine dependence, unspecified, uncomplicated: Secondary | ICD-10-CM

## 2015-12-08 DIAGNOSIS — I10 Essential (primary) hypertension: Secondary | ICD-10-CM

## 2015-12-08 DIAGNOSIS — Z720 Tobacco use: Secondary | ICD-10-CM

## 2015-12-08 LAB — CBC WITH DIFFERENTIAL/PLATELET
BASOS PCT: 0 %
Basophils Absolute: 0 cells/uL (ref 0–200)
EOS ABS: 201 {cells}/uL (ref 15–500)
Eosinophils Relative: 3 %
HEMATOCRIT: 40.7 % (ref 38.5–50.0)
Hemoglobin: 14.1 g/dL (ref 13.2–17.1)
LYMPHS PCT: 29 %
Lymphs Abs: 1943 cells/uL (ref 850–3900)
MCH: 34.3 pg — ABNORMAL HIGH (ref 27.0–33.0)
MCHC: 34.6 g/dL (ref 32.0–36.0)
MCV: 99 fL (ref 80.0–100.0)
MONO ABS: 603 {cells}/uL (ref 200–950)
MPV: 9.2 fL (ref 7.5–12.5)
Monocytes Relative: 9 %
NEUTROS ABS: 3953 {cells}/uL (ref 1500–7800)
Neutrophils Relative %: 59 %
PLATELETS: 262 10*3/uL (ref 140–400)
RBC: 4.11 MIL/uL — ABNORMAL LOW (ref 4.20–5.80)
RDW: 13.4 % (ref 11.0–15.0)
WBC: 6.7 10*3/uL (ref 4.0–10.5)

## 2015-12-08 LAB — LIPID PANEL
CHOL/HDL RATIO: 4.4 ratio (ref <=5.0)
Cholesterol: 189 mg/dL (ref 125–200)
HDL: 43 mg/dL (ref >=40)
LDL CALC: 116 mg/dL (ref <130)
Triglycerides: 152 mg/dL — ABNORMAL HIGH (ref <150)
VLDL: 30 mg/dL (ref <30)

## 2015-12-08 LAB — COMPREHENSIVE METABOLIC PANEL
ALT: 17 U/L (ref 9–46)
AST: 18 U/L (ref 10–35)
Albumin: 4.4 g/dL (ref 3.6–5.1)
Alkaline Phosphatase: 74 U/L (ref 40–115)
BUN: 15 mg/dL (ref 7–25)
CHLORIDE: 102 mmol/L (ref 98–110)
CO2: 29 mmol/L (ref 20–31)
Calcium: 9.4 mg/dL (ref 8.6–10.3)
Creat: 0.89 mg/dL (ref 0.70–1.25)
GLUCOSE: 110 mg/dL — AB (ref 65–99)
POTASSIUM: 4.2 mmol/L (ref 3.5–5.3)
SODIUM: 138 mmol/L (ref 135–146)
Total Bilirubin: 0.6 mg/dL (ref 0.2–1.2)
Total Protein: 7.2 g/dL (ref 6.1–8.1)

## 2015-12-08 MED ORDER — ELVITEG-COBIC-EMTRICIT-TENOFDF 150-150-200-300 MG PO TABS: ORAL_TABLET | ORAL | Status: DC

## 2015-12-08 MED ORDER — TELMISARTAN 40 MG PO TABS: ORAL_TABLET | ORAL | Status: DC

## 2015-12-08 NOTE — Progress Notes (Signed)
   Subjective:    Patient ID: Jeffery Franklin, male    DOB: 1951/01/05, 65 y.o.   MRN: [id number]  HPI  he is here for a recheck. He is doing quite well. He has no particular concerns or complaints. No fever, chills, weight loss, GI issues. His work and home life are going quite well. He smokes roughly 10 cigarettes per day and at this point is not interested in quitting. He continues on his Micardis for his hypertension.   Review of Systems     Objective:   Physical Exam Alert and in no distress.  Fundi are benign.Tympanic membranes and canals are normal. Pharyngeal area is normal. Neck is supple without adenopathy or thyromegaly. Cardiac exam shows a regular sinus rhythm without murmurs or gallops. Lungs are clear to auscultation.  Abdominal exam shows no masses or tenderness with normal bowel sounds. No inguinal or  Axillary adenopathy.        Assessment & Plan:  Essential hypertension - Plan: telmisartan (MICARDIS) 40 MG tablet  Smoker  AIDS (acquired immunodeficiency syndrome), CD4 <=200 ([place]) - Plan: CBC with Differential/Platelet, Comprehensive metabolic panel, Lipid panel, HIV 1 RNA quant-no reflex-bld, T-helper cells (CD4) count (not at [facility]), elvitegravir-cobicistat-emtricitabine-tenofovir (STRIBILD) 150-150-200-300 MG TABS tablet  continues to do quite nicely. I will renew his medications. I again discussed the need for him to quit smoking entirely but did not push the issue.

## 2015-12-09 LAB — T-HELPER CELLS (CD4) COUNT (NOT AT ARMC)
ABSOLUTE CD4: 586 {cells}/uL (ref 381–1469)
CD4 T HELPER %: 30 % — AB (ref 32–62)
Total lymphocyte count: 1967 cells/uL (ref 700–3300)

## 2015-12-10 LAB — HIV-1 RNA QUANT-NO REFLEX-BLD: HIV-1 RNA Quant, Log: <1.3 Log copies/mL (ref <1.30)

## 2015-12-14 LAB — HEMOGLOBIN A1C
Hgb A1c MFr Bld: 5.5 % (ref <5.7)
Mean Plasma Glucose: 111 mg/dL

## 2016-03-06 ENCOUNTER — Encounter: Payer: Self-pay | Admitting: Family Medicine

## 2016-03-13 ENCOUNTER — Ambulatory Visit (INDEPENDENT_AMBULATORY_CARE_PROVIDER_SITE_OTHER): Payer: 59 | Admitting: Family Medicine

## 2016-03-13 DIAGNOSIS — Z716 Tobacco abuse counseling: Secondary | ICD-10-CM | POA: Diagnosis not present

## 2016-03-13 DIAGNOSIS — F17200 Nicotine dependence, unspecified, uncomplicated: Secondary | ICD-10-CM

## 2016-03-13 DIAGNOSIS — Z720 Tobacco use: Secondary | ICD-10-CM

## 2016-03-13 MED ORDER — BUPROPION HCL ER (SR) 150 MG PO TB12: 150.0000 mg | ORAL_TABLET | Freq: Two times a day (BID) | ORAL | 1 refills | Status: DC

## 2016-03-13 NOTE — Progress Notes (Signed)
   Subjective:    Patient ID: Jeffery Franklin, male    DOB: 05-30-1951, 65 y.o.   MRN: [id number]  HPI He is here for consult concerning smoking cessation. He is now involved in a program called [employer] from smoking. He is now one month into the program. He has started keeping a log of when, where and why he smokes. Apparently next to work on implementation. He has a quit date set for August 25. He is interested in going on bupropion. He has apparently been on this in the past.   Review of Systems     Objective:   Physical Exam Alert and in no distress otherwise not examined       Assessment & Plan:  Smoker - Plan: buPROPion (WELLBUTRIN SR) 150 MG 12 hr tablet  Encounter for smoking cessation counseling - Plan: buPROPion (WELLBUTRIN SR) 150 MG 12 hr tablet I encouraged him to continue with his present regimen. Discussed the need to implement this and also have positive things on the list to do instead of smoking. Also discussed the possibility of smoking again if he comes smoke-free specially regard to avoiding having just one cigarette or he comes stress be on his normal coping mechanisms. He will follow-up with me with his next regular appointment in about 2 months.

## 2016-06-12 ENCOUNTER — Encounter: Payer: Self-pay | Admitting: Family Medicine

## 2016-06-12 ENCOUNTER — Ambulatory Visit (INDEPENDENT_AMBULATORY_CARE_PROVIDER_SITE_OTHER): Payer: 59 | Admitting: Family Medicine

## 2016-06-12 VITALS — BP 138/80 | HR 57 | Wt 156.0 lb

## 2016-06-12 DIAGNOSIS — R011 Cardiac murmur, unspecified: Secondary | ICD-10-CM | POA: Diagnosis not present

## 2016-06-12 DIAGNOSIS — B20 Human immunodeficiency virus [HIV] disease: Secondary | ICD-10-CM | POA: Diagnosis not present

## 2016-06-12 DIAGNOSIS — Z87891 Personal history of nicotine dependence: Secondary | ICD-10-CM | POA: Diagnosis not present

## 2016-06-12 DIAGNOSIS — Z23 Encounter for immunization: Secondary | ICD-10-CM

## 2016-06-12 DIAGNOSIS — I10 Essential (primary) hypertension: Secondary | ICD-10-CM

## 2016-06-12 LAB — LIPID PANEL
Cholesterol: 179 mg/dL (ref <200)
HDL: 49 mg/dL (ref >40)
LDL Cholesterol: 104 mg/dL — ABNORMAL HIGH (ref <100)
Total CHOL/HDL Ratio: 3.7 Ratio (ref <5.0)
Triglycerides: 131 mg/dL (ref <150)
VLDL: 26 mg/dL (ref <30)

## 2016-06-12 LAB — COMPREHENSIVE METABOLIC PANEL
ALBUMIN: 4.4 g/dL (ref 3.6–5.1)
ALK PHOS: 70 U/L (ref 40–115)
ALT: 15 U/L (ref 9–46)
AST: 18 U/L (ref 10–35)
BILIRUBIN TOTAL: 0.6 mg/dL (ref 0.2–1.2)
BUN: 16 mg/dL (ref 7–25)
CALCIUM: 9.3 mg/dL (ref 8.6–10.3)
CO2: 29 mmol/L (ref 20–31)
CREATININE: 1.01 mg/dL (ref 0.70–1.25)
Chloride: 103 mmol/L (ref 98–110)
GLUCOSE: 90 mg/dL (ref 65–99)
Potassium: 4.2 mmol/L (ref 3.5–5.3)
SODIUM: 138 mmol/L (ref 135–146)
Total Protein: 7.1 g/dL (ref 6.1–8.1)

## 2016-06-12 NOTE — Progress Notes (Signed)
   Subjective:    Patient ID: Jeffery Franklin, male    DOB: 07/16/1951, 65 y.o.   MRN: [id number]  HPI He is here for recheck. He continues on his HIV meds as well as antihypertensive medications. He is having no difficulty with them. He's had no chest pain, shortness of breath, PND or DOE. No weight change. He states that he is now a former smoker. No other concerns or complaints.   Review of Systems     Objective:   Physical Exam Alert and in no distress. Tympanic membranes and canals are normal. Pharyngeal area is normal. Neck is supple without adenopathy or thyromegaly. Cardiac exam shows a regular sinus rhythm with a 1-2/6 SEM murmur, no gallops. Lungs are clear to auscultation. EKG shows no acute changes      Assessment & Plan:  AIDS (acquired immunodeficiency syndrome), CD4 <=200 ([place]) - Plan: CBC with Differential/Platelet, Comprehensive metabolic panel, Lipid panel, HIV 1 RNA quant-no reflex-bld, T-helper cells (CD4) count (not at [facility])  Essential hypertension - Plan: CBC with Differential/Platelet, Comprehensive metabolic panel  Need for prophylactic vaccination and inoculation against influenza - Plan: Flu Vaccine QUAD 0000000 mos IM  Systolic murmur - Plan: EKG 12-Lead, ECHOCARDIOGRAM COMPLETE  Former smoker I congratulated him on quitting smoking. I will also order an echocardiogram. He did state that he has remote history of being told he had a murmur but no workup was apparently done.

## 2016-06-13 LAB — CBC WITH DIFFERENTIAL/PLATELET
BASOS PCT: 0 %
Basophils Absolute: 0 cells/uL (ref 0–200)
Eosinophils Absolute: 213 cells/uL (ref 15–500)
Eosinophils Relative: 3 %
HEMATOCRIT: 39.5 % (ref 38.5–50.0)
HEMOGLOBIN: 13.3 g/dL (ref 13.2–17.1)
LYMPHS ABS: 1988 {cells}/uL (ref 850–3900)
Lymphocytes Relative: 28 %
MCH: 33.4 pg — ABNORMAL HIGH (ref 27.0–33.0)
MCHC: 33.7 g/dL (ref 32.0–36.0)
MCV: 99.2 fL (ref 80.0–100.0)
MONO ABS: 710 {cells}/uL (ref 200–950)
MPV: 8.9 fL (ref 7.5–12.5)
Monocytes Relative: 10 %
Neutro Abs: 4189 cells/uL (ref 1500–7800)
Neutrophils Relative %: 59 %
Platelets: 275 10*3/uL (ref 140–400)
RBC: 3.98 MIL/uL — AB (ref 4.20–5.80)
RDW: 13.7 % (ref 11.0–15.0)
WBC: 7.1 10*3/uL (ref 4.0–10.5)

## 2016-06-13 LAB — T-HELPER CELLS (CD4) COUNT (NOT AT ARMC)
ABSOLUTE CD4: 657 {cells}/uL (ref 381–1469)
CD4 T HELPER %: 32 % (ref 32–62)
TOTAL LYMPHOCYTE COUNT: 2056 {cells}/uL (ref 700–3300)

## 2016-06-15 LAB — HIV-1 RNA QUANT-NO REFLEX-BLD
HIV 1 RNA QUANT: 437 {copies}/mL — AB (ref <20)
HIV-1 RNA QUANT, LOG: 2.64 {Log_copies}/mL — AB (ref <1.30)

## 2016-06-16 ENCOUNTER — Encounter: Payer: Self-pay | Admitting: Family Medicine

## 2016-06-28 ENCOUNTER — Other Ambulatory Visit: Payer: Self-pay

## 2016-06-28 ENCOUNTER — Ambulatory Visit (HOSPITAL_COMMUNITY): Payer: 59 | Attending: Cardiology

## 2016-06-28 DIAGNOSIS — I071 Rheumatic tricuspid insufficiency: Secondary | ICD-10-CM | POA: Diagnosis not present

## 2016-06-28 DIAGNOSIS — I340 Nonrheumatic mitral (valve) insufficiency: Secondary | ICD-10-CM | POA: Diagnosis not present

## 2016-06-28 DIAGNOSIS — R012 Other cardiac sounds: Secondary | ICD-10-CM | POA: Diagnosis present

## 2016-06-28 DIAGNOSIS — Z87891 Personal history of nicotine dependence: Secondary | ICD-10-CM | POA: Insufficient documentation

## 2016-06-28 DIAGNOSIS — I10 Essential (primary) hypertension: Secondary | ICD-10-CM | POA: Insufficient documentation

## 2016-06-28 DIAGNOSIS — R011 Cardiac murmur, unspecified: Secondary | ICD-10-CM

## 2016-06-30 ENCOUNTER — Encounter: Payer: Self-pay | Admitting: Family Medicine

## 2016-08-02 ENCOUNTER — Encounter: Payer: Self-pay | Admitting: Family Medicine

## 2016-08-04 ENCOUNTER — Telehealth: Payer: Self-pay | Admitting: Family Medicine

## 2016-08-04 NOTE — Telephone Encounter (Signed)
See telephone call 08/04/16

## 2016-08-04 NOTE — Telephone Encounter (Signed)
Jeffery Franklin T# [phone] pt has new ID# [id number] and obtained approval for Stribild til 08/04/18 PA# [id number].  Called pt & informed.

## 2016-09-05 ENCOUNTER — Telehealth: Payer: Self-pay | Admitting: Internal Medicine

## 2016-09-05 DIAGNOSIS — B20 Human immunodeficiency virus [HIV] disease: Secondary | ICD-10-CM

## 2016-09-05 NOTE — Telephone Encounter (Signed)
Pt is coming in for lab work tomorrow, please put in future order

## 2016-09-06 ENCOUNTER — Other Ambulatory Visit: Payer: 59

## 2016-09-06 DIAGNOSIS — B20 Human immunodeficiency virus [HIV] disease: Secondary | ICD-10-CM

## 2016-09-13 LAB — HIV-1 RNA QUANT-NO REFLEX-BLD
HIV 1 RNA QUANT: NOT DETECTED {copies}/mL
HIV-1 RNA QUANT, LOG: NOT DETECTED {Log_copies}/mL

## 2016-09-14 ENCOUNTER — Encounter: Payer: Self-pay | Admitting: Family Medicine

## 2016-10-03 ENCOUNTER — Ambulatory Visit (INDEPENDENT_AMBULATORY_CARE_PROVIDER_SITE_OTHER): Payer: 59 | Admitting: Family Medicine

## 2016-10-03 VITALS — BP 140/80 | HR 70 | Wt 154.0 lb

## 2016-10-03 DIAGNOSIS — N3001 Acute cystitis with hematuria: Secondary | ICD-10-CM

## 2016-10-03 LAB — POCT URINALYSIS DIPSTICK
Bilirubin, UA: NEGATIVE
Glucose, UA: NEGATIVE
Ketones, UA: NEGATIVE
NITRITE UA: NEGATIVE
PH UA: 6
Protein, UA: NEGATIVE
SPEC GRAV UA: 1.015
UROBILINOGEN UA: NEGATIVE

## 2016-10-03 MED ORDER — SULFAMETHOXAZOLE-TRIMETHOPRIM 800-160 MG PO TABS: 1.0000 | ORAL_TABLET | Freq: Two times a day (BID) | ORAL | 0 refills | Status: DC

## 2016-10-03 NOTE — Addendum Note (Signed)
Addended by: Randel Books on: 10/03/2016 12:00 PM   Modules accepted: Orders

## 2016-10-03 NOTE — Progress Notes (Signed)
   Subjective:    Patient ID: Jeffery Franklin, male    DOB: 09/12/1950, 66 y.o.   MRN: [id number]  HPI He complains of a one-day history of dysuria, frequency, nocturia and urgency. This morning he also notes a slight pinkish tinge to the urine. No discharge, fever, chills, change in sexual activity. He is in a monogamous relationship. He continues on his HIV meds. Last viral load was undetectable.   Review of Systems     Objective:   Physical Exam [place] and in no distress. Genital exam shows normal penis and testes. Rectal exam shows a slightly enlarged prostate that is tender to palpation but not edematous. Urine microscopic showed crenated red cells       Assessment & Plan:  Acute cystitis with hematuria - Plan: Ambulatory referral to Urology, Urine culture, sulfamethoxazole-trimethoprim (BACTRIM DS,SEPTRA DS) 800-160 MG tablet Septra DS pending culture results. Also referral to urology to look for an underlying cause.

## 2016-10-06 LAB — URINE CULTURE

## 2016-10-10 ENCOUNTER — Encounter: Payer: Self-pay | Admitting: Family Medicine

## 2016-12-09 ENCOUNTER — Other Ambulatory Visit: Payer: Self-pay | Admitting: Family Medicine

## 2016-12-09 DIAGNOSIS — B20 Human immunodeficiency virus [HIV] disease: Secondary | ICD-10-CM

## 2016-12-11 NOTE — Telephone Encounter (Signed)
Is this okay to refill? 

## 2017-01-19 ENCOUNTER — Other Ambulatory Visit: Payer: Self-pay | Admitting: Family Medicine

## 2017-01-19 DIAGNOSIS — I10 Essential (primary) hypertension: Secondary | ICD-10-CM

## 2017-03-07 ENCOUNTER — Encounter: Payer: Self-pay | Admitting: Family Medicine

## 2017-03-07 ENCOUNTER — Other Ambulatory Visit: Payer: Self-pay | Admitting: Family Medicine

## 2017-03-07 ENCOUNTER — Ambulatory Visit (INDEPENDENT_AMBULATORY_CARE_PROVIDER_SITE_OTHER): Payer: 59 | Admitting: Family Medicine

## 2017-03-07 VITALS — BP 150/80 | HR 64 | Resp 16 | Wt 154.8 lb

## 2017-03-07 DIAGNOSIS — Z79899 Other long term (current) drug therapy: Secondary | ICD-10-CM

## 2017-03-07 DIAGNOSIS — B20 Human immunodeficiency virus [HIV] disease: Secondary | ICD-10-CM | POA: Diagnosis not present

## 2017-03-07 DIAGNOSIS — I10 Essential (primary) hypertension: Secondary | ICD-10-CM

## 2017-03-07 LAB — CBC WITH DIFFERENTIAL/PLATELET
BASOS ABS: 66 {cells}/uL (ref 0–200)
Basophils Relative: 1 %
EOS PCT: 3 %
Eosinophils Absolute: 198 cells/uL (ref 15–500)
HCT: 42.4 % (ref 38.5–50.0)
Hemoglobin: 14.4 g/dL (ref 13.2–17.1)
Lymphocytes Relative: 32 %
Lymphs Abs: 2112 cells/uL (ref 850–3900)
MCH: 34.3 pg — AB (ref 27.0–33.0)
MCHC: 34 g/dL (ref 32.0–36.0)
MCV: 101 fL — ABNORMAL HIGH (ref 80.0–100.0)
MONOS PCT: 9 %
MPV: 9.2 fL (ref 7.5–12.5)
Monocytes Absolute: 594 cells/uL (ref 200–950)
NEUTROS ABS: 3630 {cells}/uL (ref 1500–7800)
NEUTROS PCT: 55 %
PLATELETS: 258 10*3/uL (ref 140–400)
RBC: 4.2 MIL/uL (ref 4.20–5.80)
RDW: 13.6 % (ref 11.0–15.0)
WBC: 6.6 10*3/uL (ref 4.0–10.5)

## 2017-03-07 MED ORDER — TELMISARTAN 40 MG PO TABS: 40.0000 mg | ORAL_TABLET | Freq: Every day | ORAL | 3 refills | Status: DC

## 2017-03-07 NOTE — Progress Notes (Signed)
   Subjective:    Patient ID: Jeffery Franklin, male    DOB: 12-31-1950, 66 y.o.   MRN: [id number]  HPI He is here for a follow-up visit concerning his underlying HIV as well as his blood pressure. He has no particular concerns or complaints. No history of fever, chills, weight change, cough, congestion. He does not smoke and drinks socially. His work is going well. He be going on Medicare within the next several months.   Review of Systems     Objective:   Physical Exam Alert and in no distress. Tympanic membranes and canals are normal. Pharyngeal area is normal. Neck is supple without adenopathy or thyromegaly. Cardiac exam shows a regular sinus rhythm without murmurs or gallops. Lungs are clear to auscultation. Abdominal exam shows no masses or tenderness. No axillary or inguinal adenopathy.       Assessment & Plan:  AIDS (acquired immunodeficiency syndrome), CD4 <=200 ([place]) - Plan: CBC with Differential/Platelet, Comprehensive metabolic panel, HIV 1 RNA quant-no reflex-bld, Lipid panel, T-helper cells (CD4) count (not at [facility])  Essential hypertension - Plan: CBC with Differential/Platelet, Comprehensive metabolic panel, telmisartan (MICARDIS) 40 MG tablet  Encounter for long-term (current) use of medications - Plan: CBC with Differential/Platelet, Comprehensive metabolic panel, HIV 1 RNA quant-no reflex-bld, Lipid panel, T-helper cells (CD4) count (not at [facility]) May consider switching him to a different HIV medication.

## 2017-03-08 ENCOUNTER — Encounter: Payer: Self-pay | Admitting: Family Medicine

## 2017-03-08 LAB — HEPATITIS C ANTIBODY: HCV Ab: NONREACTIVE

## 2017-03-08 LAB — COMPREHENSIVE METABOLIC PANEL
ALT: 16 U/L (ref 9–46)
AST: 18 U/L (ref 10–35)
Albumin: 4.3 g/dL (ref 3.6–5.1)
Alkaline Phosphatase: 67 U/L (ref 40–115)
BUN: 15 mg/dL (ref 7–25)
CHLORIDE: 103 mmol/L (ref 98–110)
CO2: 25 mmol/L (ref 20–32)
CREATININE: 0.94 mg/dL (ref 0.70–1.25)
Calcium: 9.1 mg/dL (ref 8.6–10.3)
Glucose, Bld: 89 mg/dL (ref 65–99)
Potassium: 4.2 mmol/L (ref 3.5–5.3)
SODIUM: 136 mmol/L (ref 135–146)
Total Bilirubin: 0.8 mg/dL (ref 0.2–1.2)
Total Protein: 7 g/dL (ref 6.1–8.1)

## 2017-03-08 LAB — T-HELPER CELLS (CD4) COUNT (NOT AT ARMC)
Absolute CD4: 774 cells/uL (ref 490–1740)
CD4 T Helper %: 36 % (ref 30–61)
Total lymphocyte count: 2155 cells/uL (ref 850–3900)

## 2017-03-12 ENCOUNTER — Encounter: Payer: Self-pay | Admitting: Family Medicine

## 2017-03-12 MED ORDER — ELVITEG-COBIC-EMTRICIT-TENOFAF 150-150-200-10 MG PO TABS: 1.0000 | ORAL_TABLET | Freq: Every day | ORAL | 3 refills | Status: DC

## 2017-03-12 NOTE — Addendum Note (Signed)
Addended by: Denita Lung on: 03/12/2017 07:53 AM   Modules accepted: Orders

## 2017-03-13 ENCOUNTER — Other Ambulatory Visit: Payer: Self-pay

## 2017-03-13 ENCOUNTER — Other Ambulatory Visit: Payer: 59

## 2017-03-13 DIAGNOSIS — B20 Human immunodeficiency virus [HIV] disease: Secondary | ICD-10-CM

## 2017-03-13 MED ORDER — ELVITEG-COBIC-EMTRICIT-TENOFAF 150-150-200-10 MG PO TABS: 1.0000 | ORAL_TABLET | Freq: Every day | ORAL | 3 refills | Status: DC

## 2017-03-14 LAB — LIPID PANEL
Cholesterol: 170 mg/dL (ref <200)
HDL: 45 mg/dL (ref >40)
LDL CALC: 105 mg/dL — AB (ref <100)
TRIGLYCERIDES: 98 mg/dL (ref <150)
Total CHOL/HDL Ratio: 3.8 Ratio (ref <5.0)
VLDL: 20 mg/dL (ref <30)

## 2017-03-15 LAB — HIV-1 RNA QUANT-NO REFLEX-BLD
HIV 1 RNA QUANT: DETECTED {copies}/mL — AB
HIV-1 RNA Quant, Log: <1.3 Log copies/mL — AB

## 2017-03-19 ENCOUNTER — Telehealth: Payer: Self-pay | Admitting: Family Medicine

## 2017-03-19 NOTE — Telephone Encounter (Signed)
P.A. GENVOYA

## 2017-03-24 NOTE — Telephone Encounter (Signed)
P.A. GENVOYA approved til 03/20/19

## 2017-03-26 ENCOUNTER — Other Ambulatory Visit: Payer: Self-pay

## 2017-03-26 ENCOUNTER — Telehealth: Payer: Self-pay | Admitting: Family Medicine

## 2017-03-26 MED ORDER — ELVITEG-COBIC-EMTRICIT-TENOFAF 150-150-200-10 MG PO TABS: 1.0000 | ORAL_TABLET | Freq: Every day | ORAL | 3 refills | Status: DC

## 2017-03-26 NOTE — Telephone Encounter (Signed)
[facility] [place]

## 2017-03-26 NOTE — Telephone Encounter (Signed)
Pt left voice mail and would like his RX genvoya sent to [facility] [place] [place] he has not gotten it  from the [place] he can be reached at [phone] with any questions

## 2017-04-03 ENCOUNTER — Other Ambulatory Visit: Payer: Self-pay | Admitting: Family Medicine

## 2017-04-03 DIAGNOSIS — B20 Human immunodeficiency virus [HIV] disease: Secondary | ICD-10-CM

## 2017-04-09 NOTE — Telephone Encounter (Signed)
Left message for pt

## 2017-04-12 ENCOUNTER — Other Ambulatory Visit: Payer: Self-pay | Admitting: Family Medicine

## 2017-04-12 DIAGNOSIS — I10 Essential (primary) hypertension: Secondary | ICD-10-CM

## 2017-04-27 ENCOUNTER — Telehealth: Payer: Self-pay | Admitting: Family Medicine

## 2017-04-27 NOTE — Telephone Encounter (Signed)
Pt would like to come in for a pneumonia & flu shot. Is this ok?

## 2017-04-27 NOTE — Telephone Encounter (Signed)
He will come in Monday at 3:45pm.

## 2017-04-27 NOTE — Telephone Encounter (Signed)
He can come in for a flu shot but does not need another pneumonia shot

## 2017-04-30 ENCOUNTER — Other Ambulatory Visit (INDEPENDENT_AMBULATORY_CARE_PROVIDER_SITE_OTHER): Payer: 59

## 2017-04-30 DIAGNOSIS — Z23 Encounter for immunization: Secondary | ICD-10-CM

## 2017-07-02 ENCOUNTER — Other Ambulatory Visit: Payer: Self-pay | Admitting: Family Medicine

## 2017-07-02 DIAGNOSIS — I10 Essential (primary) hypertension: Secondary | ICD-10-CM

## 2017-07-03 NOTE — Telephone Encounter (Signed)
Is pt due for labs? Please advise. Victorino December

## 2017-07-11 ENCOUNTER — Encounter: Payer: Self-pay | Admitting: Family Medicine

## 2017-09-26 ENCOUNTER — Ambulatory Visit: Payer: Self-pay | Admitting: Family Medicine

## 2017-10-15 ENCOUNTER — Encounter: Payer: Self-pay | Admitting: Family Medicine

## 2017-10-15 ENCOUNTER — Ambulatory Visit: Payer: 59 | Admitting: Family Medicine

## 2017-10-15 VITALS — BP 136/88 | HR 72 | Wt 161.2 lb

## 2017-10-15 DIAGNOSIS — B20 Human immunodeficiency virus [HIV] disease: Secondary | ICD-10-CM | POA: Diagnosis not present

## 2017-10-15 DIAGNOSIS — Z8042 Family history of malignant neoplasm of prostate: Secondary | ICD-10-CM

## 2017-10-15 DIAGNOSIS — Z136 Encounter for screening for cardiovascular disorders: Secondary | ICD-10-CM | POA: Diagnosis not present

## 2017-10-15 DIAGNOSIS — I10 Essential (primary) hypertension: Secondary | ICD-10-CM | POA: Diagnosis not present

## 2017-10-15 MED ORDER — ELVITEG-COBIC-EMTRICIT-TENOFAF 150-150-200-10 MG PO TABS: 1.0000 | ORAL_TABLET | Freq: Every day | ORAL | 3 refills | Status: DC

## 2017-10-15 MED ORDER — TELMISARTAN-HCTZ 40-12.5 MG PO TABS: 1.0000 | ORAL_TABLET | Freq: Every day | ORAL | 3 refills | Status: DC

## 2017-10-15 NOTE — Progress Notes (Signed)
   Subjective:    Patient ID: Jeffery Franklin, male    DOB: 05-16-1951, 67 y.o.   MRN: [id number]  HPI He is here for a recheck.  He continues to do quite nicely on [place].  Continues on his Micardis for his blood pressure.  Otherwise he has no other concerns or complaints.  Further history with him indicates his brother and father have had prostate cancer.  Smoking and drinking were reviewed.  He is a former smoker his work is going well.   Review of Systems  All other systems reviewed and are negative.      Objective:   Physical Exam Alert and in no distress. Tympanic membranes and canals are normal. Pharyngeal area is normal. Neck is supple without adenopathy or thyromegaly. Cardiac exam shows a regular sinus rhythm without murmurs or gallops. Lungs are clear to auscultation.  Abdominal exam shows no hepatosplenomegaly masses or tenderness.        Assessment & Plan:  AIDS (acquired immunodeficiency syndrome), CD4 <=200 ([place]) - Plan: CBC with Differential/Platelet, Comprehensive metabolic panel, Lipid panel, HIV 1 RNA quant-no reflex-bld, elvitegravir-cobicistat-emtricitabine-tenofovir (GENVOYA) 150-150-200-10 MG TABS tablet, CANCELED: T-helper cells (CD4) count (not at [facility])  Essential hypertension - Plan: telmisartan-hydrochlorothiazide (MICARDIS HCT) 40-12.5 MG tablet  Screening for AAA (abdominal aortic aneurysm) - Plan: US ABDOMINAL AORTA SCREENING AAA  Family history of prostate cancer - Plan: PSA I did discuss the limited CT of his chest and gave him the statistics on that.  At this point he is holding off on the CT scan. I did increase his BP medication as he is not at goal.

## 2017-10-16 LAB — LIPID PANEL
Chol/HDL Ratio: 3.4 ratio (ref 0.0–5.0)
Cholesterol, Total: 186 mg/dL (ref 100–199)
HDL: 54 mg/dL (ref >39)
LDL Calculated: 108 mg/dL — ABNORMAL HIGH (ref 0–99)
Triglycerides: 122 mg/dL (ref 0–149)
VLDL CHOLESTEROL CAL: 24 mg/dL (ref 5–40)

## 2017-10-16 LAB — T-HELPER CELLS (CD4) COUNT (NOT AT ARMC)
% CD 4 Pos. Lymph.: 35.2 % (ref 30.8–58.5)
ABSOLUTE CD 4 HELPER: 634 /uL (ref 359–1519)
BASOS ABS: 0 10*3/uL (ref 0.0–0.2)
Basos: 0 %
EOS (ABSOLUTE): 0.2 10*3/uL (ref 0.0–0.4)
EOS: 3 %
Hematocrit: 39 % (ref 37.5–51.0)
Hemoglobin: 13.8 g/dL (ref 13.0–17.7)
Immature Grans (Abs): 0 10*3/uL (ref 0.0–0.1)
Immature Granulocytes: 0 %
LYMPHS ABS: 1.8 10*3/uL (ref 0.7–3.1)
Lymphs: 26 %
MCH: 34.5 pg — AB (ref 26.6–33.0)
MCHC: 35.4 g/dL (ref 31.5–35.7)
MCV: 98 fL — AB (ref 79–97)
MONOS ABS: 0.6 10*3/uL (ref 0.1–0.9)
Monocytes: 10 %
Neutrophils Absolute: 4.1 10*3/uL (ref 1.4–7.0)
Neutrophils: 61 %
PLATELETS: 260 10*3/uL (ref 150–379)
RBC: 4 x10E6/uL — AB (ref 4.14–5.80)
RDW: 13.5 % (ref 12.3–15.4)
WBC: 6.7 10*3/uL (ref 3.4–10.8)

## 2017-10-16 LAB — HIV-1 RNA QUANT-NO REFLEX-BLD

## 2017-10-16 LAB — COMPREHENSIVE METABOLIC PANEL
A/G RATIO: 1.9 (ref 1.2–2.2)
ALK PHOS: 68 IU/L (ref 39–117)
ALT: 17 IU/L (ref 0–44)
AST: 18 IU/L (ref 0–40)
Albumin: 4.4 g/dL (ref 3.6–4.8)
BILIRUBIN TOTAL: 0.4 mg/dL (ref 0.0–1.2)
BUN / CREAT RATIO: 15 (ref 10–24)
BUN: 14 mg/dL (ref 8–27)
CHLORIDE: 99 mmol/L (ref 96–106)
CO2: 23 mmol/L (ref 20–29)
Calcium: 9.2 mg/dL (ref 8.6–10.2)
Creatinine, Ser: 0.93 mg/dL (ref 0.76–1.27)
GFR calc non Af Amer: 86 mL/min/{1.73_m2} (ref >59)
GFR, EST AFRICAN AMERICAN: 99 mL/min/{1.73_m2} (ref >59)
GLUCOSE: 101 mg/dL — AB (ref 65–99)
Globulin, Total: 2.3 g/dL (ref 1.5–4.5)
POTASSIUM: 4.3 mmol/L (ref 3.5–5.2)
Sodium: 137 mmol/L (ref 134–144)
TOTAL PROTEIN: 6.7 g/dL (ref 6.0–8.5)

## 2017-10-16 LAB — PSA: PROSTATE SPECIFIC AG, SERUM: 1.3 ng/mL (ref 0.0–4.0)

## 2017-10-19 ENCOUNTER — Telehealth: Payer: Self-pay | Admitting: Internal Medicine

## 2017-10-19 NOTE — Telephone Encounter (Signed)
Left message for pt to call back and schedule shingrix shot. Got 2 saved in refrig

## 2017-10-23 ENCOUNTER — Ambulatory Visit (HOSPITAL_COMMUNITY)
Admission: RE | Admit: 2017-10-23 | Discharge: 2017-10-23 | Disposition: A | Discharge status: Home / Self Care | Payer: 59 | Source: Ambulatory Visit | Attending: Family Medicine | Admitting: Family Medicine

## 2017-10-23 ENCOUNTER — Other Ambulatory Visit (INDEPENDENT_AMBULATORY_CARE_PROVIDER_SITE_OTHER): Payer: 59

## 2017-10-23 DIAGNOSIS — Z136 Encounter for screening for cardiovascular disorders: Secondary | ICD-10-CM | POA: Insufficient documentation

## 2017-10-23 DIAGNOSIS — Z23 Encounter for immunization: Secondary | ICD-10-CM | POA: Diagnosis not present

## 2017-10-23 DIAGNOSIS — I700 Atherosclerosis of aorta: Secondary | ICD-10-CM | POA: Diagnosis not present

## 2017-10-24 ENCOUNTER — Encounter: Payer: Self-pay | Admitting: Family Medicine

## 2018-03-25 ENCOUNTER — Ambulatory Visit: Payer: 59 | Admitting: Family Medicine

## 2018-03-25 ENCOUNTER — Encounter: Payer: Self-pay | Admitting: Family Medicine

## 2018-03-25 VITALS — BP 138/76 | HR 54 | Temp 98.2°F | Wt 156.6 lb

## 2018-03-25 DIAGNOSIS — Z87891 Personal history of nicotine dependence: Secondary | ICD-10-CM | POA: Diagnosis not present

## 2018-03-25 DIAGNOSIS — I10 Essential (primary) hypertension: Secondary | ICD-10-CM | POA: Diagnosis not present

## 2018-03-25 DIAGNOSIS — J209 Acute bronchitis, unspecified: Secondary | ICD-10-CM

## 2018-03-25 DIAGNOSIS — Z23 Encounter for immunization: Secondary | ICD-10-CM

## 2018-03-25 DIAGNOSIS — Z79899 Other long term (current) drug therapy: Secondary | ICD-10-CM

## 2018-03-25 DIAGNOSIS — B20 Human immunodeficiency virus [HIV] disease: Secondary | ICD-10-CM | POA: Diagnosis not present

## 2018-03-25 MED ORDER — TELMISARTAN-HCTZ 40-12.5 MG PO TABS: 1.0000 | ORAL_TABLET | Freq: Every day | ORAL | 3 refills | Status: DC

## 2018-03-25 MED ORDER — AMOXICILLIN 875 MG PO TABS: 875.0000 mg | ORAL_TABLET | Freq: Two times a day (BID) | ORAL | 0 refills | Status: DC

## 2018-03-25 MED ORDER — ELVITEG-COBIC-EMTRICIT-TENOFAF 150-150-200-10 MG PO TABS: 1.0000 | ORAL_TABLET | Freq: Every day | ORAL | 3 refills | Status: DC

## 2018-03-25 NOTE — Progress Notes (Signed)
   Subjective:    Patient ID: Jeffery Franklin, male    DOB: 12-04-1950, 67 y.o.   MRN: [id number]  HPI He is here for an interval evaluation.  He continues on his blood pressure medication as well as [name] and is having no difficulty with them.  He has had URI symptoms that started about 3 weeks ago and still has a residual cough but no fever, chills, sore throat or earache.  No weight changes, or other GI symptoms.  He does not smoke.  He has had a AAA evaluation.  His immunizations were reviewed.   Review of Systems     Objective:   Physical Exam Alert and in no distress. Tympanic membranes and canals are normal. Pharyngeal area is normal. Neck is supple without adenopathy or thyromegaly. Cardiac exam shows a regular sinus rhythm without murmurs or gallops. Lungs are clear to auscultation.  Abdominal exam shows no masses or tenderness with normal bowel sounds        Assessment & Plan:  AIDS (acquired immunodeficiency syndrome), CD4 <=200 ([place]) - Plan: CBC with Differential/Platelet, Comprehensive metabolic panel, HIV 1 RNA quant-no reflex-bld, Lipid panel, T-helper cells (CD4) count (not at [facility]), elvitegravir-cobicistat-emtricitabine-tenofovir (GENVOYA) 150-150-200-10 MG TABS tablet  Former smoker  Essential hypertension - Plan: telmisartan-hydrochlorothiazide (MICARDIS HCT) 40-12.5 MG tablet  Acute bronchitis, unspecified organism - Plan: amoxicillin (AMOXIL) 875 MG tablet  Need for shingles vaccine - Plan: Varicella-zoster vaccine IM  History of long-term treatment with high-risk medication - Plan: CBC with Differential/Platelet, Comprehensive metabolic panel, Lipid panel He will call me if not entirely back to normal when he finishes antibiotic.  Did discuss possibly switching him to a different HIV meds if he is comfortable with the Lamisil he is on.

## 2018-03-26 DIAGNOSIS — Z23 Encounter for immunization: Secondary | ICD-10-CM | POA: Diagnosis not present

## 2018-03-26 LAB — T-HELPER CELLS (CD4) COUNT (NOT AT ARMC)
% CD 4 Pos. Lymph.: 38.4 % (ref 30.8–58.5)
Absolute CD 4 Helper: 768 /uL (ref 359–1519)
BASOS ABS: 0 10*3/uL (ref 0.0–0.2)
BASOS: 1 %
EOS (ABSOLUTE): 0.2 10*3/uL (ref 0.0–0.4)
EOS: 4 %
HEMOGLOBIN: 13.7 g/dL (ref 13.0–17.7)
Hematocrit: 39.8 % (ref 37.5–51.0)
Immature Grans (Abs): 0 10*3/uL (ref 0.0–0.1)
Immature Granulocytes: 0 %
LYMPHS ABS: 2 10*3/uL (ref 0.7–3.1)
Lymphs: 31 %
MCH: 34.2 pg — AB (ref 26.6–33.0)
MCHC: 34.4 g/dL (ref 31.5–35.7)
MCV: 99 fL — AB (ref 79–97)
MONOS ABS: 0.7 10*3/uL (ref 0.1–0.9)
Monocytes: 11 %
Neutrophils Absolute: 3.5 10*3/uL (ref 1.4–7.0)
Neutrophils: 53 %
PLATELETS: 295 10*3/uL (ref 150–450)
RBC: 4.01 x10E6/uL — AB (ref 4.14–5.80)
RDW: 12.1 % — ABNORMAL LOW (ref 12.3–15.4)
WBC: 6.5 10*3/uL (ref 3.4–10.8)

## 2018-03-26 LAB — COMPREHENSIVE METABOLIC PANEL
A/G RATIO: 2 (ref 1.2–2.2)
ALBUMIN: 4.3 g/dL (ref 3.6–4.8)
ALK PHOS: 66 IU/L (ref 39–117)
ALT: 14 IU/L (ref 0–44)
AST: 21 IU/L (ref 0–40)
BUN/Creatinine Ratio: 16 (ref 10–24)
BUN: 16 mg/dL (ref 8–27)
Bilirubin Total: 0.5 mg/dL (ref 0.0–1.2)
CO2: 25 mmol/L (ref 20–29)
CREATININE: 1.01 mg/dL (ref 0.76–1.27)
Calcium: 9.5 mg/dL (ref 8.6–10.2)
Chloride: 94 mmol/L — ABNORMAL LOW (ref 96–106)
GFR calc Af Amer: 90 mL/min/{1.73_m2} (ref >59)
GFR calc non Af Amer: 78 mL/min/{1.73_m2} (ref >59)
GLOBULIN, TOTAL: 2.2 g/dL (ref 1.5–4.5)
Glucose: 97 mg/dL (ref 65–99)
POTASSIUM: 4.5 mmol/L (ref 3.5–5.2)
SODIUM: 133 mmol/L — AB (ref 134–144)
Total Protein: 6.5 g/dL (ref 6.0–8.5)

## 2018-03-26 LAB — HIV-1 RNA QUANT-NO REFLEX-BLD

## 2018-03-26 LAB — LIPID PANEL
CHOL/HDL RATIO: 3.3 ratio (ref 0.0–5.0)
Cholesterol, Total: 195 mg/dL (ref 100–199)
HDL: 59 mg/dL (ref >39)
LDL Calculated: 112 mg/dL — ABNORMAL HIGH (ref 0–99)
Triglycerides: 120 mg/dL (ref 0–149)
VLDL Cholesterol Cal: 24 mg/dL (ref 5–40)

## 2018-04-05 ENCOUNTER — Emergency Department (HOSPITAL_COMMUNITY): Payer: 59

## 2018-04-05 ENCOUNTER — Encounter (HOSPITAL_COMMUNITY): Payer: Self-pay | Admitting: Emergency Medicine

## 2018-04-05 ENCOUNTER — Observation Stay (HOSPITAL_COMMUNITY)
Admission: EM | Admit: 2018-04-05 | Discharge: 2018-04-06 | Disposition: A | Discharge status: Home / Self Care | Payer: 59 | Attending: Internal Medicine | Admitting: Internal Medicine

## 2018-04-05 ENCOUNTER — Other Ambulatory Visit: Payer: Self-pay

## 2018-04-05 DIAGNOSIS — M810 Age-related osteoporosis without current pathological fracture: Secondary | ICD-10-CM | POA: Diagnosis not present

## 2018-04-05 DIAGNOSIS — H933X9 Disorders of unspecified acoustic nerve: Secondary | ICD-10-CM | POA: Diagnosis present

## 2018-04-05 DIAGNOSIS — B20 Human immunodeficiency virus [HIV] disease: Secondary | ICD-10-CM | POA: Diagnosis present

## 2018-04-05 DIAGNOSIS — Z79899 Other long term (current) drug therapy: Secondary | ICD-10-CM | POA: Insufficient documentation

## 2018-04-05 DIAGNOSIS — H8123 Vestibular neuronitis, bilateral: Secondary | ICD-10-CM

## 2018-04-05 DIAGNOSIS — R269 Unspecified abnormalities of gait and mobility: Secondary | ICD-10-CM

## 2018-04-05 DIAGNOSIS — R42 Dizziness and giddiness: Secondary | ICD-10-CM

## 2018-04-05 DIAGNOSIS — R112 Nausea with vomiting, unspecified: Secondary | ICD-10-CM | POA: Diagnosis present

## 2018-04-05 DIAGNOSIS — I10 Essential (primary) hypertension: Secondary | ICD-10-CM | POA: Diagnosis present

## 2018-04-05 DIAGNOSIS — F17200 Nicotine dependence, unspecified, uncomplicated: Secondary | ICD-10-CM | POA: Diagnosis present

## 2018-04-05 DIAGNOSIS — H8120 Vestibular neuronitis, unspecified ear: Secondary | ICD-10-CM | POA: Diagnosis present

## 2018-04-05 DIAGNOSIS — Z85828 Personal history of other malignant neoplasm of skin: Secondary | ICD-10-CM | POA: Insufficient documentation

## 2018-04-05 DIAGNOSIS — R2681 Unsteadiness on feet: Secondary | ICD-10-CM

## 2018-04-05 DIAGNOSIS — E876 Hypokalemia: Secondary | ICD-10-CM | POA: Diagnosis not present

## 2018-04-05 LAB — COMPREHENSIVE METABOLIC PANEL
ALT: 20 U/L (ref 0–44)
AST: 20 U/L (ref 15–41)
Albumin: 3.8 g/dL (ref 3.5–5.0)
Alkaline Phosphatase: 61 U/L (ref 38–126)
Anion gap: 10 (ref 5–15)
BUN: 10 mg/dL (ref 8–23)
CHLORIDE: 99 mmol/L (ref 98–111)
CO2: 26 mmol/L (ref 22–32)
Calcium: 8.8 mg/dL — ABNORMAL LOW (ref 8.9–10.3)
Creatinine, Ser: 0.76 mg/dL (ref 0.61–1.24)
GFR calc non Af Amer: >60 mL/min (ref >60)
Glucose, Bld: 148 mg/dL — ABNORMAL HIGH (ref 70–99)
POTASSIUM: 3.3 mmol/L — AB (ref 3.5–5.1)
SODIUM: 135 mmol/L (ref 135–145)
Total Bilirubin: 1 mg/dL (ref 0.3–1.2)
Total Protein: 6.9 g/dL (ref 6.5–8.1)

## 2018-04-05 LAB — TROPONIN I: Troponin I: <0.03 ng/mL (ref <0.03)

## 2018-04-05 LAB — URINALYSIS, ROUTINE W REFLEX MICROSCOPIC
Bilirubin Urine: NEGATIVE
GLUCOSE, UA: NEGATIVE mg/dL
Hgb urine dipstick: NEGATIVE
Ketones, ur: NEGATIVE mg/dL
LEUKOCYTES UA: NEGATIVE
Nitrite: NEGATIVE
PH: 6 (ref 5.0–8.0)
Protein, ur: NEGATIVE mg/dL
SPECIFIC GRAVITY, URINE: 1.011 (ref 1.005–1.030)

## 2018-04-05 LAB — CBC
HEMATOCRIT: 42.3 % (ref 39.0–52.0)
Hemoglobin: 14.6 g/dL (ref 13.0–17.0)
MCH: 34.5 pg — ABNORMAL HIGH (ref 26.0–34.0)
MCHC: 34.5 g/dL (ref 30.0–36.0)
MCV: 100 fL (ref 78.0–100.0)
Platelets: 247 10*3/uL (ref 150–400)
RBC: 4.23 MIL/uL (ref 4.22–5.81)
RDW: 12.3 % (ref 11.5–15.5)
WBC: 7.2 10*3/uL (ref 4.0–10.5)

## 2018-04-05 LAB — LIPASE, BLOOD: LIPASE: 41 U/L (ref 11–51)

## 2018-04-05 MED ORDER — TELMISARTAN-HCTZ 40-12.5 MG PO TABS: 1.0000 | ORAL_TABLET | Freq: Every day | ORAL | Status: DC

## 2018-04-05 MED ORDER — METOCLOPRAMIDE HCL 5 MG/ML IJ SOLN: 10.0000 mg | Freq: Three times a day (TID) | INTRAMUSCULAR | Status: DC | PRN

## 2018-04-05 MED ORDER — PREDNISONE 20 MG PO TABS: 20.0000 mg | ORAL_TABLET | Freq: Every day | ORAL | Status: DC

## 2018-04-05 MED ORDER — ELVITEG-COBIC-EMTRICIT-TENOFAF 150-150-200-10 MG PO TABS
1.0000 | ORAL_TABLET | Freq: Every day | ORAL | Status: DC
  Filled 2018-04-05 (×2): qty 1

## 2018-04-05 MED ORDER — SODIUM CHLORIDE 0.9 % IV SOLN
INTRAVENOUS | Status: AC
  Administered 2018-04-05: 19:00:00 via INTRAVENOUS

## 2018-04-05 MED ORDER — PREDNISONE 20 MG PO TABS: 50.0000 mg | ORAL_TABLET | Freq: Every day | ORAL | Status: DC

## 2018-04-05 MED ORDER — PREDNISONE 10 MG PO TABS: 10.0000 mg | ORAL_TABLET | Freq: Every day | ORAL | Status: DC

## 2018-04-05 MED ORDER — IRBESARTAN 150 MG PO TABS: 150.0000 mg | ORAL_TABLET | Freq: Every day | ORAL | Status: DC

## 2018-04-05 MED ORDER — MECLIZINE HCL 25 MG PO TABS
25.0000 mg | ORAL_TABLET | Freq: Three times a day (TID) | ORAL | Status: DC | PRN
  Administered 2018-04-06: 25 mg via ORAL
  Filled 2018-04-05 (×3): qty 1

## 2018-04-05 MED ORDER — PREDNISONE 20 MG PO TABS
60.0000 mg | ORAL_TABLET | Freq: Every day | ORAL | Status: DC
  Administered 2018-04-06: 60 mg via ORAL
  Filled 2018-04-05: qty 3

## 2018-04-05 MED ORDER — TELMISARTAN-HCTZ 40-12.5 MG PO TABS
1.0000 | ORAL_TABLET | Freq: Every day | ORAL | Status: DC
  Administered 2018-04-06: 1 via ORAL
  Filled 2018-04-05 (×2): qty 1

## 2018-04-05 MED ORDER — ENOXAPARIN SODIUM 40 MG/0.4ML ~~LOC~~ SOLN
40.0000 mg | SUBCUTANEOUS | Status: DC
  Administered 2018-04-05: 40 mg via SUBCUTANEOUS
  Filled 2018-04-05: qty 0.4

## 2018-04-05 MED ORDER — PREDNISONE 20 MG PO TABS: 40.0000 mg | ORAL_TABLET | Freq: Every day | ORAL | Status: DC

## 2018-04-05 MED ORDER — ENOXAPARIN SODIUM 30 MG/0.3ML ~~LOC~~ SOLN: 30.0000 mg | SUBCUTANEOUS | Status: DC

## 2018-04-05 MED ORDER — HYDROCHLOROTHIAZIDE 12.5 MG PO CAPS: 12.5000 mg | ORAL_CAPSULE | Freq: Every day | ORAL | Status: DC

## 2018-04-05 MED ORDER — ELVITEG-COBIC-EMTRICIT-TENOFAF 150-150-200-10 MG PO TABS
1.0000 | ORAL_TABLET | Freq: Every day | ORAL | Status: DC
  Administered 2018-04-06: 1 via ORAL
  Filled 2018-04-05: qty 1

## 2018-04-05 MED ORDER — MECLIZINE HCL 25 MG PO TABS
25.0000 mg | ORAL_TABLET | Freq: Once | ORAL | Status: AC
  Administered 2018-04-05: 25 mg via ORAL
  Filled 2018-04-05: qty 1

## 2018-04-05 MED ORDER — PREDNISONE 20 MG PO TABS: 30.0000 mg | ORAL_TABLET | Freq: Every day | ORAL | Status: DC

## 2018-04-05 MED ORDER — POTASSIUM CHLORIDE CRYS ER 20 MEQ PO TBCR
40.0000 meq | EXTENDED_RELEASE_TABLET | ORAL | Status: AC
  Administered 2018-04-05: 40 meq via ORAL
  Filled 2018-04-05: qty 2

## 2018-04-05 MED ORDER — METOCLOPRAMIDE HCL 5 MG/ML IJ SOLN
10.0000 mg | Freq: Once | INTRAMUSCULAR | Status: AC
  Administered 2018-04-05: 10 mg via INTRAVENOUS
  Filled 2018-04-05: qty 2

## 2018-04-05 MED ORDER — ELVITEG-COBIC-EMTRICIT-TENOFAF 150-150-200-10 MG PO TABS
1.0000 | ORAL_TABLET | Freq: Every day | ORAL | Status: DC
  Filled 2018-04-05: qty 1

## 2018-04-05 MED ORDER — NICOTINE 21 MG/24HR TD PT24: 21.0000 mg | MEDICATED_PATCH | Freq: Once | TRANSDERMAL | Status: DC

## 2018-04-05 MED ORDER — PREDNISONE 20 MG PO TABS
60.0000 mg | ORAL_TABLET | Freq: Once | ORAL | Status: AC
  Administered 2018-04-05: 60 mg via ORAL
  Filled 2018-04-05: qty 3

## 2018-04-05 NOTE — ED Notes (Signed)
Patient returned from MRI. Ambulated to restroom with 2 person assist. Reports feeling very lightheaded.

## 2018-04-05 NOTE — Plan of Care (Signed)
Pt progressing towards discharge

## 2018-04-05 NOTE — H&P (Signed)
History and Physical    Jeffery Franklin ZOX:[id number] DOB: 1950-11-24 DOA: 04/05/2018  PCP: Denita Lung, MD Consultants:  Neurology Patient coming from: home- lives with partner  Chief Complaint: Dizziness  HPI: Jeffery Franklin is a 67 y.o. male with medical history significant for AIDS (viral load undetectable:CD4 count 768), HLD, HTN , BCC of right temple in 2013 who presented to the emergency department by EMS with complaints of vertigo, nausea and vomiting.  Patient went to bed last night in his usual state of health and when he woke up this morning he said he felt extremely dizzy like the room was spinning.  Patient was unsteady on his feet when walking to the bathroom.  He has had no double vision, headache, chest pain, shortness of breath, focal weakness or numbness, slurred speech.  He smokes 1 pack a day.  Other than being started on hydrochlorothiazide about a month ago he has had no new medications and has not recently stopped any medications.  He has been on a 10-day course of antibiotics for an upper respiratory infection and his last dose was today.  She has had several episodes of nonbilious nonbloody emesis today.  This was not relieved with Zofran but did improve with Reglan and meclizine.  ED Course: Potassium was 3.3.  MRI/MRA of the head was normal.  Was seen by the neurologist and felt that he had vestibular neuritis.  He was going to go home on a prednisone taper but was very ataxic when ambulating and had another episode of emesis.  Review of Systems: As per HPI; otherwise review of systems reviewed and negative.   Ambulatory Status:  Ambulates without assistance at baseline  Past Medical History:  Diagnosis Date  . Anemia   . Basal cell carcinoma    right temple/ 2013  . HIV (human immunodeficiency virus infection) ([place])   . Hyperlipidemia   . Hypertension   . Osteoporosis    osteopenia in past    Past Surgical History:  Procedure Laterality Date  .  COLONOSCOPY    . HERNIA REPAIR  10/2013   Bilat inguinal hernia  . skin cancer removed     right temple    Social History   Socioeconomic History  . Marital status: Married    Spouse name: Not on file  . Number of children: 0  . Years of education: Not on file  . Highest education level: Not on file  Occupational History  . Occupation: Psychologist, counselling: [employer]  . Financial resource strain: Not on file  . Food insecurity:    Worry: Not on file    Inability: Not on file  . Transportation needs:    Medical: Not on file    Non-medical: Not on file  Tobacco Use  . Smoking status: Former Smoker    Packs/day: 0.50    Years: 20.00    Pack years: 10.00    Types: Cigarettes    Last attempt to quit: 03/31/2016    Years since quitting: 2.0  . Smokeless tobacco: Never Used  Substance and Sexual Activity  . Alcohol use: Yes    Alcohol/week: 14.0 standard drinks    Types: 14 Glasses of wine per week    Comment: 1-2 glasses of red wine daily  . Drug use: No  . Sexual activity: Yes  Lifestyle  . Physical activity:    Days per week: Not on file    Minutes per  session: Not on file  . Stress: Not on file  Relationships  . Social connections:    Talks on phone: Not on file    Gets together: Not on file    Attends religious service: Not on file    Active member of club or organization: Not on file    Attends meetings of clubs or organizations: Not on file    Relationship status: Not on file  . Intimate partner violence:    Fear of current or ex partner: Not on file    Emotionally abused: Not on file    Physically abused: Not on file    Forced sexual activity: Not on file  Other Topics Concern  . Not on file  Social History Narrative  . Not on file    No Known Allergies  Family History  Problem Relation Age of Onset  . Heart disease Mother   . Heart disease Father   . Diabetes Father   . Prostate cancer Father   . Diabetes Sister     . Heart disease Brother   . Prostate cancer Brother   . Hypertension Brother   . Colon cancer Neg Hx   . Esophageal cancer Neg Hx   . Rectal cancer Neg Hx   . Stomach cancer Neg Hx     Prior to Admission medications   Medication Sig Start Date End Date Taking? Authorizing Provider  elvitegravir-cobicistat-emtricitabine-tenofovir (GENVOYA) 150-150-200-10 MG TABS tablet Take 1 tablet by mouth daily with breakfast. 03/25/18  Yes Denita Lung, MD  telmisartan-hydrochlorothiazide (MICARDIS HCT) 40-12.5 MG tablet Take 1 tablet by mouth daily. 03/25/18  Yes Denita Lung, MD    Physical Exam: Vitals:   04/05/18 1400 04/05/18 1430 04/05/18 1500 04/05/18 1700  BP: 136/62 121/65 (!) 147/82 134/71  Pulse: (!) 56 61 64 (!) 58  Resp: 15 14 18 16   Temp:      TempSrc:      SpO2: 96% 97% 97% 96%  Weight:      Height:         . General:  Appears calm and comfortable and is in NAD . Eyes:  PERRL, EOMI, normal lids, iris . ENT:  grossly normal hearing, lips & tongue, mmm; appropriate dentition . Neck:  no LAD, masses or thyromegaly; no carotid bruits . Cardiovascular:  normal rate, reg rhythm, no murmur. Marland Kitchen Respiratory:   CTA bilaterally with no wheezes/rales/rhonchi.  Normal respiratory effort. . Abdomen:  soft, NT, ND, NABS . Back:   grossly normal alignment, no CVAT . Skin:  no rash or induration seen on limited exam . Musculoskeletal:  grossly normal tone BUE/BLE, good ROM, no bony abnormality or obvious joint deformity . Lower extremity:  No LE edema.  Limited foot exam with no ulcerations.  2+ distal pulses. Marland Kitchen Psychiatric:  grossly normal mood and affect, speech fluent and appropriate, AOx3 . Neurologic:  CN 2-12 grossly intact, moves all extremities in coordinated fashion, sensation intact    Radiological Exams on Admission: Mr Jeffery Franklin [facility] Contrast  Result Date: 04/05/2018 CLINICAL DATA:  67 year old male with sudden onset of dizziness, nausea vomiting. EXAM: MRI HEAD WITHOUT  CONTRAST MRA HEAD WITHOUT CONTRAST TECHNIQUE: Multiplanar, multiecho pulse sequences of the brain and surrounding structures were obtained without intravenous contrast. Angiographic images of the head were obtained using MRA technique without contrast. COMPARISON:  Brain MRI 12/15/2004. FINDINGS: MRI HEAD FINDINGS Brain: No restricted diffusion to suggest acute infarction. No midline shift, mass effect, evidence of mass lesion,  ventriculomegaly, extra-axial collection or acute intracranial hemorrhage. Cervicomedullary junction and pituitary are within normal limits. Pearline Cables and white matter signal remains largely normal for age throughout the brain. No cortical encephalomalacia identified. There is a questionable small chronic microhemorrhage in right frontal operculum on series 9, image 64, but no other chronic blood products identified. Generalized cerebral volume loss since 2006, but volume remains normal for age. The deep gray matter nuclei, brainstem, and cerebellum are normal. Vascular: Major intracranial vascular flow voids are stable since 2006 and within normal limits. Skull and upper cervical spine: Negative visible cervical spine. Normal bone marrow signal. Sinuses/Orbits: Normal orbits soft tissues. Frontal and ethmoid sinus mucosal thickening and opacification throughout which is new since 2006. Mild to moderate bilateral maxillary sinus mucosal thickening. Other: Mastoid air cells remain clear. Visible internal auditory structures appear normal. Scalp soft tissues appear negative. MRA HEAD FINDINGS Antegrade flow in the posterior circulation with fairly codominant distal vertebral arteries. Patent PICA origins with no distal vertebral stenosis. Patent vertebrobasilar junction and basilar artery without stenosis. Bilateral AICA, SCA and PCA origins are patent and appear normal. Posterior communicating arteries are diminutive or absent. Normal bilateral PCA branches Antegrade flow in both ICA siphons. The  left siphon appears mildly dominant which may be related to dominance of the left ACA A1 segment. No siphon stenosis. Normal ophthalmic artery origins. Normal carotid termini, MCA and ACA origins. Anterior communicating artery and visible ACA branches are within normal limits. MCA M1 segments, MCA bifurcations, and visible bilateral MCA branches appear normal. IMPRESSION: 1. No acute intracranial abnormality and largely normal for age noncontrast MRI appearance of the brain; questionable solitary chronic microhemorrhage in the right hemisphere. 2. Normal intracranial MRA. 3. New bilateral paranasal sinus disease since 2006. Consider acute or chronic sinusitis. Electronically Signed   By: Genevie Ann M.D.   On: 04/05/2018 11:37   Mr Brain Wo Contrast  Result Date: 04/05/2018 CLINICAL DATA:  66 year old male with sudden onset of dizziness, nausea vomiting. EXAM: MRI HEAD WITHOUT CONTRAST MRA HEAD WITHOUT CONTRAST TECHNIQUE: Multiplanar, multiecho pulse sequences of the brain and surrounding structures were obtained without intravenous contrast. Angiographic images of the head were obtained using MRA technique without contrast. COMPARISON:  Brain MRI 12/15/2004. FINDINGS: MRI HEAD FINDINGS Brain: No restricted diffusion to suggest acute infarction. No midline shift, mass effect, evidence of mass lesion, ventriculomegaly, extra-axial collection or acute intracranial hemorrhage. Cervicomedullary junction and pituitary are within normal limits. Pearline Cables and white matter signal remains largely normal for age throughout the brain. No cortical encephalomalacia identified. There is a questionable small chronic microhemorrhage in right frontal operculum on series 9, image 64, but no other chronic blood products identified. Generalized cerebral volume loss since 2006, but volume remains normal for age. The deep gray matter nuclei, brainstem, and cerebellum are normal. Vascular: Major intracranial vascular flow voids are stable since  2006 and within normal limits. Skull and upper cervical spine: Negative visible cervical spine. Normal bone marrow signal. Sinuses/Orbits: Normal orbits soft tissues. Frontal and ethmoid sinus mucosal thickening and opacification throughout which is new since 2006. Mild to moderate bilateral maxillary sinus mucosal thickening. Other: Mastoid air cells remain clear. Visible internal auditory structures appear normal. Scalp soft tissues appear negative. MRA HEAD FINDINGS Antegrade flow in the posterior circulation with fairly codominant distal vertebral arteries. Patent PICA origins with no distal vertebral stenosis. Patent vertebrobasilar junction and basilar artery without stenosis. Bilateral AICA, SCA and PCA origins are patent and appear normal. Posterior communicating arteries are diminutive  or absent. Normal bilateral PCA branches Antegrade flow in both ICA siphons. The left siphon appears mildly dominant which may be related to dominance of the left ACA A1 segment. No siphon stenosis. Normal ophthalmic artery origins. Normal carotid termini, MCA and ACA origins. Anterior communicating artery and visible ACA branches are within normal limits. MCA M1 segments, MCA bifurcations, and visible bilateral MCA branches appear normal. IMPRESSION: 1. No acute intracranial abnormality and largely normal for age noncontrast MRI appearance of the brain; questionable solitary chronic microhemorrhage in the right hemisphere. 2. Normal intracranial MRA. 3. New bilateral paranasal sinus disease since 2006. Consider acute or chronic sinusitis. Electronically Signed   By: Genevie Ann M.D.   On: 04/05/2018 11:37    EKG: Independently reviewed.   Date/Time:                  Friday April 05 2018 06:50:22 EDT Ventricular Rate:         48 PR Interval:                   QRS Duration: 129 QT Interval:                 475 QTC Calculation:        425 R Axis:                         84 Text Interpretation:       Sinus rhythm  Supraventricular bigeminy IVCD, consider atypical RBBB Confirmed by Quintella Reichert [phone]) on 04/05/2018 7:27:22 AM   Labs on Admission: I have personally reviewed the available labs and imaging studies at the time of the admission.  Pertinent labs:  Sodium 135, potassium 3.3, glucose 148, creatinine 0.76, calcium 8.8, LFTs within normal limits, troponin less than 0.03, white blood cells 7.2, hemoglobin 14.6, platelets 247.  Urinalysis was in normal limits.    Assessment/Plan Principal Problem:   Gait disturbance Active Problems:   Smoker   HTN (hypertension)   AIDS (acquired immunodeficiency syndrome), CD4 <=200 (HCC)   Vestibular neuritis   Nausea & vomiting   Vertigo   Hypokalemia   Gait disturbance: This is felt due to vestibular neuritis.  He has been ruled out for acute CVA.  He has no infectious symptoms currently to suggest meningitis.  He was going to be discharged home from the ED to complete a steroid taper but due to his severe ataxia, it was felt this would be unsafe.  Will admit to observation. -Prednisone taper as per neurology's note -Physical therapy eval and treat -Anticipate discharge home in the morning if ataxia has improved -Continue meclizine, Reglan as needed -Will gently hydrate given nausea and vomiting  AIDS: -Continue Genvoya  Hypertension: -Continue telmisartan hydrochlorothiazide home dose  Hypokalemia -Will replete and recheck in the morning  Current smoker -Provide cessation counseling -Nicotine patch    DVT prophylaxis:  Lovenox  Code Status:  Full - confirmed with patient/family Family Communication: none  Disposition Plan:  Home once clinically improved; likely in a.m. Consults called: Neurology  Admission status: It is my clinical opinion that referral for OBSERVATION is reasonable and necessary in this patient based on the above information provided. The aforementioned taken together are felt to place the patient at high risk for  further clinical deterioration. However it is anticipated that the patient may be medically stable for discharge from the hospital within 24 to 48 hours.     Janora Norlander  MD [facility]  If note is complete, please contact covering daytime or nighttime physician. [email or web address] Password [facility]  04/05/2018, 5:33 PM

## 2018-04-05 NOTE — ED Triage Notes (Signed)
Pt BIB EMS from home with sudden onset dizziness, which led to N/V. Given 8mg  zofran by EMS with no noticeable improvement. Denies cardiac or vertigo hx. Began taking HCTZ recently for HTN.

## 2018-04-05 NOTE — Progress Notes (Signed)
Pt requested to be able to take genvoya & telmisa/HCTZ home medications from own supply. Medications supplied by pt inventoried and secured with Main [facility]. Paged Blount, MD to adjust orders for those medications to allow Pharmacy to dispense from home supply. Blount, MD to speak with Pharmacy.

## 2018-04-05 NOTE — Consult Note (Addendum)
NEURO HOSPITALIST      Requesting Physician: Dr. Leonette Monarch    Chief Complaint:  Sudden onset dizziness  History obtained from:  Patient    HPI:                                                                                                                                         Jeffery Franklin is an 67 y.o. male a PMH  Significant for AIDS  (viral load undetectable: CD4 count 768), HLD, HTN , BCC of right temple in 2013 who presents to the emergency department by EMS with a chief complaint of dizziness, Nausea, vomiting not relieved by zofran.   Patient went to bed last night in his usual state of health. This morning when he woke up about 0500 he noticed that he felt very dizzy. He laid back down to see if it would improve, but he did not. He had his partner help him get to the bathroom. They both report that he was very unsteady on his feet. Patient states that it is like the room is moving. " he feels that the world looks like when the cursor on the computer gets stuck and you see it drag across the screen". He feels that he can not focus. Denies double vision, CP, SOB, numbness, slurred speech, tingling, weakness. Endorses smoking 1 pack per day. Patient states that 1 month ago he started on HCTZ, but nothing new since that time. He is currently on a 10 day course of antibiotics prescribed by his PCP for a lingering viral illness. His last dose of antibiotic is today. Patient has had non-bilious emesis several times today. He has been given zofran, reglan, and meclizine.   ED course:  Potassium: 3.3 BG: 148 MRI / MRAHead: normal MRA; no acute abnormality, questionable chronic  microhemorrhage in right hemisphere.  Past Medical History:  Diagnosis Date  . Anemia   . Basal cell carcinoma    right temple/ 2013  . HIV (human immunodeficiency virus infection) ([place])   . Hyperlipidemia   . Hypertension   . Osteoporosis    osteopenia in past     Past Surgical History:  Procedure Laterality Date  . COLONOSCOPY    . HERNIA REPAIR  10/2013   Bilat inguinal hernia  . skin cancer removed     right temple    Family History  Problem Relation Age of Onset  . Heart disease Mother   . Heart disease Father   . Diabetes Father   . Prostate cancer Father   . Diabetes Sister   . Heart disease Brother   . Prostate cancer  Brother   . Hypertension Brother   . Colon cancer Neg Hx   . Esophageal cancer Neg Hx   . Rectal cancer Neg Hx   . Stomach cancer Neg Hx          Social History:  reports that he quit smoking about 2 years ago. His smoking use included cigarettes. He has a 10.00 pack-year smoking history. He has never used smokeless tobacco. He reports that he drinks about 14.0 standard drinks of alcohol per week. He reports that he does not use drugs.  Allergies: No Known Allergies  Medications:                                                                                                                           No current facility-administered medications for this encounter.    Current Outpatient Medications  Medication Sig Dispense Refill  . elvitegravir-cobicistat-emtricitabine-tenofovir (GENVOYA) 150-150-200-10 MG TABS tablet Take 1 tablet by mouth daily with breakfast. 90 tablet 3  . telmisartan-hydrochlorothiazide (MICARDIS HCT) 40-12.5 MG tablet Take 1 tablet by mouth daily. 90 tablet 3  . amoxicillin (AMOXIL) 875 MG tablet Take 1 tablet (875 mg total) by mouth 2 (two) times daily. (Patient not taking: Reported on 04/05/2018) 20 tablet 0    ROS:                                                                                                                                       History obtained from the patient  General ROS: negative for - chills, fatigue, fever, night sweats, weight gain or weight loss Ophthalmic ROS: positive for: room spinning negative for - blurry vision, double vision, eye pain or loss of  vision Respiratory ROS: negative for - cough,  shortness of breath or wheezing Cardiovascular ROS: negative for - chest pain, dyspnea on exertion,  Musculoskeletal ROS: negative for - joint swelling or muscular weakness Neurological ROS: as noted in HPI   General Examination:  Blood pressure 131/80, pulse 63, temperature (!) 97.4 F (36.3 C), temperature source Oral, resp. rate 20, height 5\' 10"  (1.778 m), weight 70.3 kg, SpO2 97 %.  HEENT-  Normocephalic, no lesions, without obvious abnormality.  Normal external eye and conjunctiva.  Cardiovascular- S1-S2 audible, pulses palpable throughout   Lungs-no rhonchi or wheezing noted, no excessive working breathing.  Saturations within normal limits on RA Abdomen- All 4 quadrants palpated and non tender, BS present x 4. Extremities- Warm, dry and intact Musculoskeletal-no joint tenderness, deformity or swelling Skin-warm and dry, no hyperpigmentation, vitiligo, or suspicious lesions  Neurological Examination Mental Status: Alert, oriented, thought content appropriate.  Speech fluent without evidence of aphasia.  Able to follow commands without difficulty. Cranial Nerves: II: Visual fields grossly normal,  III,IV, VI: ptosis not present, extra-ocular motions intact bilaterally, Patient has left beating nystagmus in all fields of gaze. Most prominent with left  lateral gaze.  Positive head impulse test, pupils equal, round, reactive to light and accommodation V,VII: smile symmetric, facial light touch sensation normal bilaterally VIII: hearing normal bilaterally IX,X: uvula rises symmetrically XI: bilateral shoulder shrug XII: midline tongue extension Motor: Right : Upper extremity   5/5    Left:     Upper extremity   5/5  Lower extremity   5/5     Lower extremity   5/5 Tone and bulk:normal tone throughout; no atrophy noted Sensory:  light  touch intact throughout, bilaterally Deep Tendon Reflexes: 2+ and symmetric biceps and patella Plantars: Right: downgoing   Left: downgoing Cerebellar: normal finger-to-nose, normal rapid alternating movements and normal heel-to-shin test Gait: deferred; reported to be unsteady gait per patient and RN.   Lab Results: Basic Metabolic Panel: Recent Labs  Lab 04/05/18 0644  NA 135  K 3.3*  CL 99  CO2 26  GLUCOSE 148*  BUN 10  CREATININE 0.76  CALCIUM 8.8*    CBC: Recent Labs  Lab 04/05/18 0644  WBC 7.2  HGB 14.6  HCT 42.3  MCV 100.0  PLT 247    Lipid Panel: No results for input(s): CHOL, TRIG, HDL, CHOLHDL, VLDL, LDLCALC in the last 168 hours.  CBG: No results for input(s): GLUCAP in the last 168 hours.  Imaging: Mr Virgel Paling [facility] Contrast  Result Date: 04/05/2018  IMPRESSION: 1. No acute intracranial abnormality and largely normal for age noncontrast MRI appearance of the brain; questionable solitary chronic microhemorrhage in the right hemisphere. 2. Normal intracranial MRA. 3. New bilateral paranasal sinus disease since 2006. Consider acute or chronic sinusitis.   Mr Brain Wo Contrast  Result Date: 04/05/2018  IMPRESSION: 1. No acute intracranial abnormality and largely normal for age noncontrast MRI appearance of the brain; questionable solitary chronic microhemorrhage in the right hemisphere. 2. Normal intracranial MRA. 3. New bilateral paranasal sinus disease since 2006. Consider acute or chronic sinusitis.       Laurey Morale, MSN, NP-C [facility] [phone]  04/05/2018, 10:11 AM   Attending physician note to follow with Assessment and plan .  I have seen the patient reviewed the above note.  He has persistent left beating nystagmus in multiple directions of gaze, head impulse test to the right is positive, no diplopia or other findings suggestive of vertigo.  Assessment: 67 y.o. with exam consistent with peripheral vertigo as well as MRI  which is negative.  At this point given the symptoms are not clearly positional I do not think BPPV is likely and therefore vestibular neuritis is most likely diagnosis.  Impression: #vestibular neuritis #vertigo  Recommendations: - start prednisone 60 mg x 5 days , then 50 mg x 1 day, then 40 mg x 1 day, then 30 mg x 1 day, then 20 mg x 1 day, then 10 mg x 1 day and stop. -Physical therapy  -Neurology will follow  Roland Rack, MD [facility] [phone]  If 7pm- 7am, please page neurology on call as listed in [place].

## 2018-04-05 NOTE — ED Provider Notes (Signed)
[facility] EMERGENCY DEPARTMENT Provider Note   CSN: [id number] Arrival date & time: 04/05/18  0602     History   Chief Complaint Chief Complaint  Patient presents with  . Nausea  . Emesis  . Dizziness    HPI Jeffery Franklin is a 67 y.o. male with a history of AIDS (viral load undetectable: CD4 count 634), HLD, HTN who presents to the emergency department by EMS with a chief complaint of dizziness.  The patient reports sudden onset dizziness, which the patient describes as the room spinning, that began this morning when he awoke.  Dizziness has been constant since onset, but has gradually started to improve.  He reports associated dysequilibrium with ambulation, epigastric pain, nonbloody, nonbilious emesis, nausea, and loose stools.  Denies headache, visual changes, chest pain, dyspnea, numbness, weakness, slurred speech, GU symptoms, neck pain or stiffness, fever, or chills.   He reports that he was started on HCTZ by his primary care provider approximately 1 month ago.  He also recently finished up a course of amoxicillin for a viral illness in July.  He reports a nonproductive lingering cough, but the remainder of his symptoms have improved.  He lives at home with his husband.  He is a current 1 pack/day smoker.  He denies vaping.  The history is provided by the patient. No language interpreter was used.    Past Medical History:  Diagnosis Date  . Anemia   . Basal cell carcinoma    right temple/ 2013  . HIV (human immunodeficiency virus infection) ([place])   . Hyperlipidemia   . Hypertension   . Osteoporosis    osteopenia in past    Patient Active Problem List   Diagnosis Date Noted  . Aortic atherosclerosis ([employer]) 10/23/2017  . AIDS (acquired immunodeficiency syndrome), CD4 <=200 ([place]) 09/17/2013  . Former smoker 09/04/2013  . Bilateral inguinal hernia (BIH) s/p lap repair 11/21/2013 08/13/2013  . HTN (hypertension) 10/18/2012    Past Surgical  History:  Procedure Laterality Date  . COLONOSCOPY    . HERNIA REPAIR  10/2013   Bilat inguinal hernia  . skin cancer removed     right temple        Home Medications    Prior to Admission medications   Medication Sig Start Date End Date Taking? Authorizing Provider  elvitegravir-cobicistat-emtricitabine-tenofovir (GENVOYA) 150-150-200-10 MG TABS tablet Take 1 tablet by mouth daily with breakfast. 03/25/18  Yes Denita Lung, MD  telmisartan-hydrochlorothiazide (MICARDIS HCT) 40-12.5 MG tablet Take 1 tablet by mouth daily. 03/25/18  Yes Denita Lung, MD    Family History Family History  Problem Relation Age of Onset  . Heart disease Mother   . Heart disease Father   . Diabetes Father   . Prostate cancer Father   . Diabetes Sister   . Heart disease Brother   . Prostate cancer Brother   . Hypertension Brother   . Colon cancer Neg Hx   . Esophageal cancer Neg Hx   . Rectal cancer Neg Hx   . Stomach cancer Neg Hx     Social History Social History   Tobacco Use  . Smoking status: Former Smoker    Packs/day: 0.50    Years: 20.00    Pack years: 10.00    Types: Cigarettes    Last attempt to quit: 03/31/2016    Years since quitting: 2.0  . Smokeless tobacco: Never Used  Substance Use Topics  . Alcohol use: Yes  Alcohol/week: 14.0 standard drinks    Types: 14 Glasses of wine per week    Comment: 1-2 glasses of red wine daily  . Drug use: No     Allergies   Patient has no known allergies.   Review of Systems Review of Systems  Constitutional: Negative for appetite change, chills and fever.  HENT: Negative for congestion.   Eyes: Negative for photophobia and visual disturbance.  Respiratory: Positive for cough (chronic). Negative for shortness of breath.   Cardiovascular: Negative for chest pain.  Gastrointestinal: Positive for diarrhea (loose), nausea and vomiting. Negative for abdominal pain.  Endocrine: Negative for polyuria.  Genitourinary: Negative  for dysuria and urgency.  Musculoskeletal: Negative for back pain, neck pain and neck stiffness.  Skin: Negative for rash.  Allergic/Immunologic: Negative for immunocompromised state.  Neurological: Positive for dizziness. Negative for syncope, weakness and headaches.  Psychiatric/Behavioral: Negative for confusion.   Physical Exam Updated Vital Signs BP (!) 147/82   Pulse 64   Temp (!) 97.4 F (36.3 C) (Oral)   Resp 18   Ht 5\' 10"  (1.778 m)   Wt 70.3 kg   SpO2 97%   BMI 22.24 kg/m   Physical Exam  Constitutional: He appears well-developed.  HENT:  Head: Normocephalic.  Eyes: Pupils are equal, round, and reactive to light. Conjunctivae are normal. Right eye exhibits no nystagmus. Left eye exhibits nystagmus.  Subtle left horizontal nystagmus.  Neck: Neck supple.  Cardiovascular: Normal rate and regular rhythm.  No murmur heard. Pulmonary/Chest: Effort normal. No stridor. No respiratory distress. He has no wheezes. He has no rales.  Abdominal: Soft. He exhibits no distension and no mass. There is tenderness. There is no rebound and no guarding. No hernia.  Neurological: He is alert.  GCS 15.  Alert and oriented x4.  Moves all 4 extremities.  Cranial nerves II through XII are grossly intact.  Sensation is intact and equal throughout.  5 out of 5 strength against resistance of the bilateral upper and lower extremities.  No pronator drift.  Mildly ataxic gait.  Skin: Skin is warm and dry.  Psychiatric: His behavior is normal.  Nursing note and vitals reviewed.  ED Treatments / Results  Labs (all labs ordered are listed, but only abnormal results are displayed) Labs Reviewed  COMPREHENSIVE METABOLIC PANEL - Abnormal; Notable for the following components:      Result Value   Potassium 3.3 (*)    Glucose, Bld 148 (*)    Calcium 8.8 (*)    All other components within normal limits  CBC - Abnormal; Notable for the following components:   MCH 34.5 (*)    All other components  within normal limits  LIPASE, BLOOD  URINALYSIS, ROUTINE W REFLEX MICROSCOPIC  TROPONIN I    EKG EKG Interpretation  Date/Time:  Friday April 05 2018 06:50:22 EDT Ventricular Rate:  48 PR Interval:    QRS Duration: 129 QT Interval:  475 QTC Calculation: 425 R Axis:   84 Text Interpretation:  Sinus rhythm Supraventricular bigeminy IVCD, consider atypical RBBB Confirmed by Quintella Reichert [phone]) on 04/05/2018 7:27:22 AM   Radiology Mr Jodene Nam Head Wo Contrast  Result Date: 04/05/2018 CLINICAL DATA:  68 year old male with sudden onset of dizziness, nausea vomiting. EXAM: MRI HEAD WITHOUT CONTRAST MRA HEAD WITHOUT CONTRAST TECHNIQUE: Multiplanar, multiecho pulse sequences of the brain and surrounding structures were obtained without intravenous contrast. Angiographic images of the head were obtained using MRA technique without contrast. COMPARISON:  Brain MRI 12/15/2004. FINDINGS: MRI  HEAD FINDINGS Brain: No restricted diffusion to suggest acute infarction. No midline shift, mass effect, evidence of mass lesion, ventriculomegaly, extra-axial collection or acute intracranial hemorrhage. Cervicomedullary junction and pituitary are within normal limits. Pear[place] Cables and white matter signal remains largely normal for age throughout the brain. No cortical encephalomalacia identified. There is a questionable small chronic microhemorrhage in right frontal operculum on series 9, image 64, but no other chronic blood products identified. Generalized cerebral volume loss since 2006, but volume remains normal for age. The deep gray matter nuclei, brainstem, and cerebellum are normal. Vascular: Major intracranial vascular flow voids are stable since 2006 and within normal limits. Skull and upper cervical spine: Negative visible cervical spine. Normal bone marrow signal. Sinuses/Orbits: Normal orbits soft tissues. Frontal and ethmoid sinus mucosal thickening and opacification throughout which is new since 2006. Mild to  moderate bilateral maxillary sinus mucosal thickening. Other: Mastoid air cells remain clear. Visible internal auditory structures appear normal. Scalp soft tissues appear negative. MRA HEAD FINDINGS Antegrade flow in the posterior circulation with fairly codominant distal vertebral arteries. Patent PICA origins with no distal vertebral stenosis. Patent vertebrobasilar junction and basilar artery without stenosis. Bilateral AICA, SCA and PCA origins are patent and appear normal. Posterior communicating arteries are diminutive or absent. Normal bilateral PCA branches Antegrade flow in both ICA siphons. The left siphon appears mildly dominant which may be related to dominance of the left ACA A1 segment. No siphon stenosis. Normal ophthalmic artery origins. Normal carotid termini, MCA and ACA origins. Anterior communicating artery and visible ACA branches are within normal limits. MCA M1 segments, MCA bifurcations, and visible bilateral MCA branches appear normal. IMPRESSION: 1. No acute intracranial abnormality and largely normal for age noncontrast MRI appearance of the brain; questionable solitary chronic microhemorrhage in the right hemisphere. 2. Normal intracranial MRA. 3. New bilateral paranasal sinus disease since 2006. Consider acute or chronic sinusitis. Electronically Signed   By: Genevie Ann M.D.   On: 04/05/2018 11:37   Mr Brain Wo Contrast  Result Date: 04/05/2018 CLINICAL DATA:  67 year old male with sudden onset of dizziness, nausea vomiting. EXAM: MRI HEAD WITHOUT CONTRAST MRA HEAD WITHOUT CONTRAST TECHNIQUE: Multiplanar, multiecho pulse sequences of the brain and surrounding structures were obtained without intravenous contrast. Angiographic images of the head were obtained using MRA technique without contrast. COMPARISON:  Brain MRI 12/15/2004. FINDINGS: MRI HEAD FINDINGS Brain: No restricted diffusion to suggest acute infarction. No midline shift, mass effect, evidence of mass lesion,  ventriculomegaly, extra-axial collection or acute intracranial hemorrhage. Cervicomedullary junction and pituitary are within normal limits. Pearline Cables and white matter signal remains largely normal for age throughout the brain. No cortical encephalomalacia identified. There is a questionable small chronic microhemorrhage in right frontal operculum on series 9, image 64, but no other chronic blood products identified. Generalized cerebral volume loss since 2006, but volume remains normal for age. The deep gray matter nuclei, brainstem, and cerebellum are normal. Vascular: Major intracranial vascular flow voids are stable since 2006 and within normal limits. Skull and upper cervical spine: Negative visible cervical spine. Normal bone marrow signal. Sinuses/Orbits: Normal orbits soft tissues. Frontal and ethmoid sinus mucosal thickening and opacification throughout which is new since 2006. Mild to moderate bilateral maxillary sinus mucosal thickening. Other: Mastoid air cells remain clear. Visible internal auditory structures appear normal. Scalp soft tissues appear negative. MRA HEAD FINDINGS Antegrade flow in the posterior circulation with fairly codominant distal vertebral arteries. Patent PICA origins with no distal vertebral stenosis. Patent vertebrobasilar junction and basilar  artery without stenosis. Bilateral AICA, SCA and PCA origins are patent and appear normal. Posterior communicating arteries are diminutive or absent. Normal bilateral PCA branches Antegrade flow in both ICA siphons. The left siphon appears mildly dominant which may be related to dominance of the left ACA A1 segment. No siphon stenosis. Normal ophthalmic artery origins. Normal carotid termini, MCA and ACA origins. Anterior communicating artery and visible ACA branches are within normal limits. MCA M1 segments, MCA bifurcations, and visible bilateral MCA branches appear normal. IMPRESSION: 1. No acute intracranial abnormality and largely normal  for age noncontrast MRI appearance of the brain; questionable solitary chronic microhemorrhage in the right hemisphere. 2. Normal intracranial MRA. 3. New bilateral paranasal sinus disease since 2006. Consider acute or chronic sinusitis. Electronically Signed   By: Genevie Ann M.D.   On: 04/05/2018 11:37    Procedures Procedures (including critical care time)  Medications Ordered in ED Medications  metoCLOPramide (REGLAN) injection 10 mg (10 mg Intravenous Given 04/05/18 0725)  meclizine (ANTIVERT) tablet 25 mg (25 mg Oral Given 04/05/18 1249)  predniSONE (DELTASONE) tablet 60 mg (60 mg Oral Given 04/05/18 1536)     Initial Impression / Assessment and Plan / ED Course  I have reviewed the triage vital signs and the nursing notes.  Pertinent labs & imaging results that were available during my care of the patient were reviewed by me and considered in my medical decision making (see chart for details).  67 year old male with a history of HIV (viral load undetectable: CD4 count 634), HLD, HTN presenting with sudden onset dizziness, nausea, and vomiting this morning.  Neuro exam with ataxia and gait instability, but otherwise grossly unremarkable without focal neurologic deficits.  Labs are notable for potassium of 3.3, likely secondary to emesis.  The patient was seen and evaluated along with Dr. Ralene Bathe, attending physician.  Given the patient's history of hypertension, smoking, and HIV, he has had a high risk for having a CVA.  Consulted neurology and spoke with Dr. Leonel Ramsay who recommended MR brain and MRA head.   Clinical Course as of Apr 06 1631  Fri Apr 05, 2018  1114 Notified per nursing staff that the patient just ambulated to the bathroom to have another bowel movement.  While ambulating, the patient appeared very ataxic and had one episode of emesis.  Reports that he felt better after he sat back down.   [MM]    Clinical Course User Index [MM] Misako Roeder A, PA-C   MR with no acute  intracranial abnormality, but questionable solitary chronic microhemorrhage in the right hemisphere.  Discussed and reviewed imaging results with Dr. Leonel Ramsay who felt the patient's symptoms are secondary to vestibular neuritis.  Doubt CVA, ICH, SAH, or meningitis.  He recommended a 10-day course of prednisone with 60 mg x 5 days, then 50 mg x 1 day, 40 mg x 1 day, 30 mg x 1 day, 20 mg x 1 day, and 10 mg x 1 day.  First 60 mg dose has been given in the ED.  The patient was also given meclizine for dizziness and Reglan for nausea and vomiting with significant improvement in his symptoms, but he continues to have significant gait instability.  Upon reevaluation, he required 2 person assistance to ambulate to the bathroom and became extremely unsteady on his feet when going from sitting to standing.  Dr. Leonel Ramsay also felt that the patient would benefit from PT.  The patient and his husband are agreeable with this plan at this  time.  Spoke with Dr. Veva Holes, hospitalist, who will accept the patient for admission.   Final Clinical Impressions(s) / ED Diagnoses   Final diagnoses:  Acute vestibular neuronitis, unspecified laterality  Gait instability    ED Discharge Orders    None       Joanne Gavel, PA-C 04/05/18 1632    Quintella Reichert, MD 04/06/18 [phone]

## 2018-04-05 NOTE — Progress Notes (Signed)
genvoya total 3 pills in bottle sent to pharmacy to verify, telmisa/hc total pills 2 sent to pharmacy for verification. Total pills confirmed by patient with RN Dayton Scrape

## 2018-04-05 NOTE — ED Notes (Signed)
MRI coming to pick up pat.

## 2018-04-05 NOTE — Plan of Care (Signed)
Pt progressing positively towards discharge

## 2018-04-06 DIAGNOSIS — H8123 Vestibular neuronitis, bilateral: Secondary | ICD-10-CM | POA: Diagnosis not present

## 2018-04-06 MED ORDER — PREDNISONE 50 MG PO TABS: 50.0000 mg | ORAL_TABLET | Freq: Every day | ORAL | 0 refills | Status: AC

## 2018-04-06 MED ORDER — PREDNISONE 20 MG PO TABS: 40.0000 mg | ORAL_TABLET | Freq: Every day | ORAL | 0 refills | Status: DC

## 2018-04-06 MED ORDER — NICOTINE 21 MG/24HR TD PT24: 21.0000 mg | MEDICATED_PATCH | Freq: Once | TRANSDERMAL | 0 refills | Status: AC

## 2018-04-06 MED ORDER — MECLIZINE HCL 25 MG PO TABS: 25.0000 mg | ORAL_TABLET | Freq: Three times a day (TID) | ORAL | 0 refills | Status: DC | PRN

## 2018-04-06 MED ORDER — PREDNISONE 10 MG PO TABS: 30.0000 mg | ORAL_TABLET | Freq: Every day | ORAL | 0 refills | Status: AC

## 2018-04-06 MED ORDER — POTASSIUM CHLORIDE CRYS ER 20 MEQ PO TBCR: 20.0000 meq | EXTENDED_RELEASE_TABLET | Freq: Two times a day (BID) | ORAL | 0 refills | Status: DC

## 2018-04-06 MED ORDER — PREDNISONE 10 MG PO TABS: 10.0000 mg | ORAL_TABLET | Freq: Every day | ORAL | 0 refills | Status: AC

## 2018-04-06 MED ORDER — POTASSIUM CHLORIDE CRYS ER 20 MEQ PO TBCR
40.0000 meq | EXTENDED_RELEASE_TABLET | Freq: Once | ORAL | Status: AC
  Administered 2018-04-06: 40 meq via ORAL
  Filled 2018-04-06: qty 2

## 2018-04-06 MED ORDER — PREDNISONE 20 MG PO TABS: 60.0000 mg | ORAL_TABLET | Freq: Every day | ORAL | 0 refills | Status: DC

## 2018-04-06 MED ORDER — PREDNISONE 20 MG PO TABS: 20.0000 mg | ORAL_TABLET | Freq: Every day | ORAL | 0 refills | Status: AC

## 2018-04-06 MED ORDER — POTASSIUM CHLORIDE CRYS ER 20 MEQ PO TBCR: 20.0000 meq | EXTENDED_RELEASE_TABLET | Freq: Two times a day (BID) | ORAL | Status: DC

## 2018-04-06 NOTE — Progress Notes (Signed)
NEURO HOSPITALIST PROGRESS NOTE   Subjective: Patient is awake, alert and feels better. Walked without assistive devices with PT. Walking better than he did yesterday.  Exam: Vitals:   04/06/18 0319 04/06/18 0630  BP: 132/65 (!) 146/75  Pulse: 62 61  Resp: 18 16  Temp:  98.1 F (36.7 C)  SpO2: 98% 96%    Physical Exam   HEENT-  Normocephalic, no lesions, without obvious abnormality.  Normal external eye and conjunctiva.   Cardiovascular- S1-S2 audible, pulses palpable throughout   Lungs-no rhonchi or wheezing noted, no excessive working breathing.  Saturations within normal limits on RA Extremities- Warm, dry and intact Musculoskeletal-no joint tenderness, deformity or swelling Skin-warm and dry, no hyperpigmentation, vitiligo, or suspicious lesions   Neuro:  Mental Status: Alert, oriented, thought content appropriate.  Speech fluent without evidence of aphasia.  Able to follow 3 step commands without difficulty. Cranial Nerves: II: Discs flat bilaterally; Visual fields grossly normal,  III,IV, VI: ptosis not present, extra-ocular motions intact bilaterally, patient still has left beating nystagmus in all fields of gaze, pupils equally round, reactive to light and accommodation V,VII: smile symmetric, facial light touch sensation normal bilaterally VIII: hearing normal bilaterally IX,X: uvula rises symmetrically XI: bilateral shoulder shrug XII: midline tongue extension Motor: Right : Upper extremity   5/5    Left:     Upper extremity   5/5  Lower extremity   5/5     Lower extremity   5/5 Tone and bulk:normal tone throughout; no atrophy noted Sensory:  light touch intact throughout, bilaterally Deep Tendon Reflexes: 2+ and symmetric biceps, patella Plantars: Right: downgoing   Left: downgoing Cerebellar: normal finger-to-nose, normal rapid alternating movements and normal heel-to-shin test Gait: deferred    Medications:  Current  Facility-Administered Medications  Medication Dose Route Frequency Provider Last Rate Last Dose  . elvitegravir-cobicistat-emtricitabine-tenofovir (GENVOYA) 150-150-200-10 MG tablet 1 tablet  1 tablet Oral Q breakfast Janora Norlander, MD   1 tablet at 04/06/18 0749  . enoxaparin (LOVENOX) injection 40 mg  40 mg Subcutaneous Q24H Janora Norlander, MD   40 mg at 04/05/18 2236  . meclizine (ANTIVERT) tablet 25 mg  25 mg Oral TID PRN Janora Norlander, MD   25 mg at 04/06/18 0749  . metoCLOPramide (REGLAN) injection 10 mg  10 mg Intravenous Q8H PRN Janora Norlander, MD      . nicotine (NICODERM CQ - dosed in mg/24 hours) patch 21 mg  21 mg Transdermal Once Janora Norlander, MD      . potassium chloride SA (K-DUR,KLOR-CON) CR tablet 20 mEq  20 mEq Oral BID Lady Deutscher, MD      . predniSONE (DELTASONE) tablet 60 mg  60 mg Oral Q breakfast Janora Norlander, MD   60 mg at 04/06/18 0749   Followed by  . [START ON 04/10/2018] predniSONE (DELTASONE) tablet 50 mg  50 mg Oral Q breakfast Janora Norlander, MD       Followed by  . [START ON 04/11/2018] predniSONE (DELTASONE) tablet 40 mg  40 mg Oral Q breakfast Janora Norlander, MD       Followed by  . [START ON 04/12/2018] predniSONE (DELTASONE) tablet 30 mg  30 mg Oral Q breakfast Janora Norlander, MD       Followed by  . [START ON 04/13/2018] predniSONE (DELTASONE) tablet 20 mg  20 mg Oral Q breakfast Janora Norlander, MD       Followed by  . [START ON 04/14/2018] predniSONE (DELTASONE) tablet 10 mg  10 mg Oral Q breakfast Janora Norlander, MD      . telmisartan-hydrochlorothiazide (MICARDIS HCT) 40-12.5 MG per tablet 1 tablet  1 tablet Oral Daily Janora Norlander, MD   1 tablet at 04/06/18 9163   Current Outpatient Medications  Medication Sig Dispense Refill  . elvitegravir-cobicistat-emtricitabine-tenofovir (GENVOYA) 150-150-200-10 MG TABS tablet Take 1 tablet by mouth daily with breakfast. 90 tablet 3  . telmisartan-hydrochlorothiazide (MICARDIS  HCT) 40-12.5 MG tablet Take 1 tablet by mouth daily. 90 tablet 3  . meclizine (ANTIVERT) 25 MG tablet Take 1 tablet (25 mg total) by mouth 3 (three) times daily as needed for dizziness. 15 tablet 0  . nicotine (NICODERM CQ - DOSED IN MG/24 HOURS) 21 mg/24hr patch Place 1 patch (21 mg total) onto the skin once for 1 dose. 28 patch 0  . potassium chloride SA (K-DUR,KLOR-CON) 20 MEQ tablet Take 1 tablet (20 mEq total) by mouth 2 (two) times daily for 10 days. 20 tablet 0  . [START ON 04/12/2018] predniSONE (DELTASONE) 10 MG tablet Take 3 tablets (30 mg total) by mouth daily with breakfast for 1 day. 3 tablet 0  . [START ON 04/14/2018] predniSONE (DELTASONE) 10 MG tablet Take 1 tablet (10 mg total) by mouth daily with breakfast for 1 day. 1 tablet 0  . [START ON 04/07/2018] predniSONE (DELTASONE) 20 MG tablet Take 3 tablets (60 mg total) by mouth daily with breakfast for 4 days. 12 tablet 0  . [START ON 04/11/2018] predniSONE (DELTASONE) 20 MG tablet Take 2 tablets (40 mg total) by mouth daily with breakfast for 1 day. 2 tablet 0  . [START ON 04/13/2018] predniSONE (DELTASONE) 20 MG tablet Take 1 tablet (20 mg total) by mouth daily with breakfast for 1 day. 1 tablet 0  . [START ON 04/10/2018] predniSONE (DELTASONE) 50 MG tablet Take 1 tablet (50 mg total) by mouth daily with breakfast for 1 day. 1 tablet 0     Pertinent Labs/Diagnostics:   Mr Virgel Paling WG Contrast  Result Date: 04/05/2018 CLINICAL DATA:  67 year old male with sudden onset of dizziness, nausea vomiting. EXAM: MRI HEAD WITHOUT CONTRAST MRA HEAD WITHOUT CONTRAST TECHNIQUE: Multiplanar, multiecho pulse sequences of the brain and surrounding structures were obtained without intravenous contrast. Angiographic images of the head were obtained using MRA technique without contrast. COMPARISON:  Brain MRI 12/15/2004. FINDINGS: MRI HEAD FINDINGS Brain: No restricted diffusion to suggest acute infarction. No midline shift, mass effect, evidence of mass  lesion, ventriculomegaly, extra-axial collection or acute intracranial hemorrhage. Cervicomedullary junction and pituitary are within normal limits. Pearline Cables and white matter signal remains largely normal for age throughout the brain. No cortical encephalomalacia identified. There is a questionable small chronic microhemorrhage in right frontal operculum on series 9, image 64, but no other chronic blood products identified. Generalized cerebral volume loss since 2006, but volume remains normal for age. The deep gray matter nuclei, brainstem, and cerebellum are normal. Vascular: Major intracranial vascular flow voids are stable since 2006 and within normal limits. Skull and upper cervical spine: Negative visible cervical spine. Normal bone marrow signal. Sinuses/Orbits: Normal orbits soft tissues. Frontal and ethmoid sinus mucosal thickening and opacification throughout which is new since 2006. Mild to moderate bilateral maxillary sinus mucosal thickening. Other: Mastoid air cells remain clear. Visible internal auditory structures appear normal. Scalp soft tissues appear negative.  MRA HEAD FINDINGS Antegrade flow in the posterior circulation with fairly codominant distal vertebral arteries. Patent PICA origins with no distal vertebral stenosis. Patent vertebrobasilar junction and basilar artery without stenosis. Bilateral AICA, SCA and PCA origins are patent and appear normal. Posterior communicating arteries are diminutive or absent. Normal bilateral PCA branches Antegrade flow in both ICA siphons. The left siphon appears mildly dominant which may be related to dominance of the left ACA A1 segment. No siphon stenosis. Normal ophthalmic artery origins. Normal carotid termini, MCA and ACA origins. Anterior communicating artery and visible ACA branches are within normal limits. MCA M1 segments, MCA bifurcations, and visible bilateral MCA branches appear normal. IMPRESSION: 1. No acute intracranial abnormality and largely  normal for age noncontrast MRI appearance of the brain; questionable solitary chronic microhemorrhage in the right hemisphere. 2. Normal intracranial MRA. 3. New bilateral paranasal sinus disease since 2006. Consider acute or chronic sinusitis. Electronically Signed   By: Genevie Ann M.D.   On: 04/05/2018 11:37   Mr Brain Wo Contrast  Result Date: 04/05/2018 CLINICAL DATA:  67 year old male with sudden onset of dizziness, nausea vomiting. EXAM: MRI HEAD WITHOUT CONTRAST MRA HEAD WITHOUT CONTRAST TECHNIQUE: Multiplanar, multiecho pulse sequences of the brain and surrounding structures were obtained without intravenous contrast. Angiographic images of the head were obtained using MRA technique without contrast. COMPARISON:  Brain MRI 12/15/2004. FINDINGS: MRI HEAD FINDINGS Brain: No restricted diffusion to suggest acute infarction. No midline shift, mass effect, evidence of mass lesion, ventriculomegaly, extra-axial collection or acute intracranial hemorrhage. Cervicomedullary junction and pituitary are within normal limits. Pearline Cables and white matter signal remains largely normal for age throughout the brain. No cortical encephalomalacia identified. There is a questionable small chronic microhemorrhage in right frontal operculum on series 9, image 64, but no other chronic blood products identified. Generalized cerebral volume loss since 2006, but volume remains normal for age. The deep gray matter nuclei, brainstem, and cerebellum are normal. Vascular: Major intracranial vascular flow voids are stable since 2006 and within normal limits. Skull and upper cervical spine: Negative visible cervical spine. Normal bone marrow signal. Sinuses/Orbits: Normal orbits soft tissues. Frontal and ethmoid sinus mucosal thickening and opacification throughout which is new since 2006. Mild to moderate bilateral maxillary sinus mucosal thickening. Other: Mastoid air cells remain clear. Visible internal auditory structures appear normal.  Scalp soft tissues appear negative. MRA HEAD FINDINGS Antegrade flow in the posterior circulation with fairly codominant distal vertebral arteries. Patent PICA origins with no distal vertebral stenosis. Patent vertebrobasilar junction and basilar artery without stenosis. Bilateral AICA, SCA and PCA origins are patent and appear normal. Posterior communicating arteries are diminutive or absent. Normal bilateral PCA branches Antegrade flow in both ICA siphons. The left siphon appears mildly dominant which may be related to dominance of the left ACA A1 segment. No siphon stenosis. Normal ophthalmic artery origins. Normal carotid termini, MCA and ACA origins. Anterior communicating artery and visible ACA branches are within normal limits. MCA M1 segments, MCA bifurcations, and visible bilateral MCA branches appear normal. IMPRESSION: 1. No acute intracranial abnormality and largely normal for age noncontrast MRI appearance of the brain; questionable solitary chronic microhemorrhage in the right hemisphere. 2. Normal intracranial MRA. 3. New bilateral paranasal sinus disease since 2006. Consider acute or chronic sinusitis. Electronically Signed   By: Genevie Ann M.D.   On: 04/05/2018 11:37   Assessment:  67 y.o. with exam consistent with peripheral vertigo as well as MRI which is negative.  At this point given the  symptoms are not clearly positional I do not think BPPV is likely and therefore vestibular neuritis is most likely diagnosis.  Impression: # vestibular neuritis  Recommendations:  - start prednisone 60 mg x 5 days , then 50 mg x 1 day, then 40 mg x 1 day, then 30 mg x 1 day, then 20 mg x 1 day, then 10 mg x 1 day and stop. -per PT- outpatient Neuro PT for vestibular rehab -Neurology will sign off at this time.please page with any questions.  Laurey Morale, MSN, NP-C [facility] [phone]  Attending neurologist's note to follow    04/06/2018, 11:54 AM

## 2018-04-06 NOTE — Progress Notes (Signed)
Pt. AO and speaking in full sentences. Pt denies any dizziness .

## 2018-04-06 NOTE — Care Management (Signed)
67 y.o. male admitted with vertigo, will need outpatient vestibular therapy. Case manager sent request to Neuro outpatient rehab.

## 2018-04-06 NOTE — Discharge Summary (Signed)
Physician Discharge Summary  Jeffery Franklin ACZ:[id number] DOB: October 14, 1950 DOA: 04/05/2018  PCP: Denita Lung, MD  Admit date: 04/05/2018 Discharge date: 04/06/2018  Admitted From: Home Disposition: Home  Recommendations for Outpatient Follow-up:  1. Follow up with PCP in 2 to 3 days 2. Please obtain potassium level at follow-up appointment   Home Health: No Equipment/Devices: None  Discharge Condition: Stable CODE STATUS: Full code Diet recommendation: Regular  Brief/Interim Summary: Jeffery Franklin is a 67 y.o. male with medical history significant for AIDS(viral load undetectable:CD4 count768), HLD, HTN, BCC of right temple in 2013who presented to the emergency department by EMS with complaints of vertigo, nausea and vomiting.  Patient went to bed last night in his usual state of health and when he woke up this morning he said he felt extremely dizzy like the room was spinning.  Patient was unsteady on his feet when walking to the bathroom.  He has had no double vision, headache, chest pain, shortness of breath, focal weakness or numbness, slurred speech.  He smokes 1 pack a day.  Other than being started on hydrochlorothiazide about a month ago he has had no new medications and has not recently stopped any medications.  He has been on a 10-day course of antibiotics for an upper respiratory infection and his last dose was today.  She has had several episodes of nonbilious nonbloody emesis today.  This was not relieved with Zofran but did improve with Reglan and meclizine.  Potassium was 3.3.  Patient was given repletion of potassium as well as a prescription for 10 days of potassium at discharge. MRI/MRA of the head was normal.  Was seen by the neurologist and felt that he had vestibular neuritis.  He was going to go home on a prednisone taper but was very ataxic when ambulating and had another episode of emesis. Patient was placed in observation overnight and had significant  improvement of his symptoms.  He was seen by physical therapy.  They recommend outpatient neurological physical therapy and will follow recommendations of Dr. Leonel Ramsay for  steroid taper.  Patient has reached maximal benefit of hospitalization.  Discharge diagnosis, prognosis, plans, follow-up, medications and treatments discussed with the patient(or responsible party) and is in agreement with the plans as described.  Patient is stable for discharge.  Discharge Diagnoses:  Principal Problem:   Vestibular neuritis Active Problems:   Hypokalemia   Smoker   AIDS (acquired immunodeficiency syndrome), CD4 <=200 (HCC)   Gait disturbance   Nausea & vomiting   Vertigo   HTN (hypertension)    Discharge Instructions  Discharge Instructions    Call MD for:  persistant dizziness or light-headedness   Complete by:  As directed    Diet - low sodium heart healthy   Complete by:  As directed    Diet - low sodium heart healthy   Complete by:  As directed    Discharge instructions   Complete by:  As directed    Make and keep your appointment with Dr. Redmond School in the next 2 to 3 days. Physical therapy has recommended outpatient neuro physical therapy Finish prednisone taper as ordered. Use meclizine as needed for dizziness.  Use sparingly.   Increase activity slowly   Complete by:  As directed    Increase activity slowly   Complete by:  As directed      Allergies as of 04/06/2018   No Known Allergies     Medication List    STOP taking these  medications   amoxicillin 875 MG tablet Commonly known as:  AMOXIL     TAKE these medications   elvitegravir-cobicistat-emtricitabine-tenofovir 150-150-200-10 MG Tabs tablet Commonly known as:  GENVOYA Take 1 tablet by mouth daily with breakfast.   meclizine 25 MG tablet Commonly known as:  ANTIVERT Take 1 tablet (25 mg total) by mouth 3 (three) times daily as needed for dizziness.   nicotine 21 mg/24hr patch Commonly known as:  NICODERM  CQ - dosed in mg/24 hours Place 1 patch (21 mg total) onto the skin once for 1 dose.   potassium chloride SA 20 MEQ tablet Commonly known as:  K-DUR,KLOR-CON Take 1 tablet (20 mEq total) by mouth 2 (two) times daily for 10 days.   predniSONE 20 MG tablet Commonly known as:  DELTASONE Take 3 tablets (60 mg total) by mouth daily with breakfast for 4 days. Start taking on:  04/07/2018   predniSONE 50 MG tablet Commonly known as:  DELTASONE Take 1 tablet (50 mg total) by mouth daily with breakfast for 1 day. Start taking on:  04/10/2018   predniSONE 20 MG tablet Commonly known as:  DELTASONE Take 2 tablets (40 mg total) by mouth daily with breakfast for 1 day. Start taking on:  04/11/2018   predniSONE 10 MG tablet Commonly known as:  DELTASONE Take 3 tablets (30 mg total) by mouth daily with breakfast for 1 day. Start taking on:  04/12/2018   predniSONE 20 MG tablet Commonly known as:  DELTASONE Take 1 tablet (20 mg total) by mouth daily with breakfast for 1 day. Start taking on:  04/13/2018   predniSONE 10 MG tablet Commonly known as:  DELTASONE Take 1 tablet (10 mg total) by mouth daily with breakfast for 1 day. Start taking on:  04/14/2018   telmisartan-hydrochlorothiazide 40-12.5 MG tablet Commonly known as:  MICARDIS HCT Take 1 tablet by mouth daily.      Follow-up Information    Denita Lung, MD. Schedule an appointment as soon as possible for a visit.   Specialty:  Family Medicine Why:  Make Appointment to be seen in the next 2 to 3 days Contact information: [address] [place] [phone]          No Known Allergies  Consultations:  Roland Rack, neurology   Procedures/Studies: Mr Virgel Paling Wo Contrast  Result Date: 04/05/2018 CLINICAL DATA:  67 year old male with sudden onset of dizziness, nausea vomiting. EXAM: MRI HEAD WITHOUT CONTRAST MRA HEAD WITHOUT CONTRAST TECHNIQUE: Multiplanar, multiecho pulse sequences of the  brain and surrounding structures were obtained without intravenous contrast. Angiographic images of the head were obtained using MRA technique without contrast. COMPARISON:  Brain MRI 12/15/2004. FINDINGS: MRI HEAD FINDINGS Brain: No restricted diffusion to suggest acute infarction. No midline shift, mass effect, evidence of mass lesion, ventriculomegaly, extra-axial collection or acute intracranial hemorrhage. Cervicomedullary junction and pituitary are within normal limits. Pearline Cables and white matter signal remains largely normal for age throughout the brain. No cortical encephalomalacia identified. There is a questionable small chronic microhemorrhage in right frontal operculum on series 9, image 64, but no other chronic blood products identified. Generalized cerebral volume loss since 2006, but volume remains normal for age. The deep gray matter nuclei, brainstem, and cerebellum are normal. Vascular: Major intracranial vascular flow voids are stable since 2006 and within normal limits. Skull and upper cervical spine: Negative visible cervical spine. Normal bone marrow signal. Sinuses/Orbits: Normal orbits soft tissues. Frontal and ethmoid sinus mucosal thickening and opacification throughout  which is new since 2006. Mild to moderate bilateral maxillary sinus mucosal thickening. Other: Mastoid air cells remain clear. Visible internal auditory structures appear normal. Scalp soft tissues appear negative. MRA HEAD FINDINGS Antegrade flow in the posterior circulation with fairly codominant distal vertebral arteries. Patent PICA origins with no distal vertebral stenosis. Patent vertebrobasilar junction and basilar artery without stenosis. Bilateral AICA, SCA and PCA origins are patent and appear normal. Posterior communicating arteries are diminutive or absent. Normal bilateral PCA branches Antegrade flow in both ICA siphons. The left siphon appears mildly dominant which may be related to dominance of the left ACA A1  segment. No siphon stenosis. Normal ophthalmic artery origins. Normal carotid termini, MCA and ACA origins. Anterior communicating artery and visible ACA branches are within normal limits. MCA M1 segments, MCA bifurcations, and visible bilateral MCA branches appear normal. IMPRESSION: 1. No acute intracranial abnormality and largely normal for age noncontrast MRI appearance of the brain; questionable solitary chronic microhemorrhage in the right hemisphere. 2. Normal intracranial MRA. 3. New bilateral paranasal sinus disease since 2006. Consider acute or chronic sinusitis. Electronically Signed   By: Genevie Ann M.D.   On: 04/05/2018 11:37   Mr Brain Wo Contrast  Result Date: 04/05/2018 CLINICAL DATA:  67 year old male with sudden onset of dizziness, nausea vomiting. EXAM: MRI HEAD WITHOUT CONTRAST MRA HEAD WITHOUT CONTRAST TECHNIQUE: Multiplanar, multiecho pulse sequences of the brain and surrounding structures were obtained without intravenous contrast. Angiographic images of the head were obtained using MRA technique without contrast. COMPARISON:  Brain MRI 12/15/2004. FINDINGS: MRI HEAD FINDINGS Brain: No restricted diffusion to suggest acute infarction. No midline shift, mass effect, evidence of mass lesion, ventriculomegaly, extra-axial collection or acute intracranial hemorrhage. Cervicomedullary junction and pituitary are within normal limits. Pearline Cables and white matter signal remains largely normal for age throughout the brain. No cortical encephalomalacia identified. There is a questionable small chronic microhemorrhage in right frontal operculum on series 9, image 64, but no other chronic blood products identified. Generalized cerebral volume loss since 2006, but volume remains normal for age. The deep gray matter nuclei, brainstem, and cerebellum are normal. Vascular: Major intracranial vascular flow voids are stable since 2006 and within normal limits. Skull and upper cervical spine: Negative visible cervical  spine. Normal bone marrow signal. Sinuses/Orbits: Normal orbits soft tissues. Frontal and ethmoid sinus mucosal thickening and opacification throughout which is new since 2006. Mild to moderate bilateral maxillary sinus mucosal thickening. Other: Mastoid air cells remain clear. Visible internal auditory structures appear normal. Scalp soft tissues appear negative. MRA HEAD FINDINGS Antegrade flow in the posterior circulation with fairly codominant distal vertebral arteries. Patent PICA origins with no distal vertebral stenosis. Patent vertebrobasilar junction and basilar artery without stenosis. Bilateral AICA, SCA and PCA origins are patent and appear normal. Posterior communicating arteries are diminutive or absent. Normal bilateral PCA branches Antegrade flow in both ICA siphons. The left siphon appears mildly dominant which may be related to dominance of the left ACA A1 segment. No siphon stenosis. Normal ophthalmic artery origins. Normal carotid termini, MCA and ACA origins. Anterior communicating artery and visible ACA branches are within normal limits. MCA M1 segments, MCA bifurcations, and visible bilateral MCA branches appear normal. IMPRESSION: 1. No acute intracranial abnormality and largely normal for age noncontrast MRI appearance of the brain; questionable solitary chronic microhemorrhage in the right hemisphere. 2. Normal intracranial MRA. 3. New bilateral paranasal sinus disease since 2006. Consider acute or chronic sinusitis. Electronically Signed   By: Herminio Heads.D.  On: 04/05/2018 11:37     Subjective:   Discharge Exam: Vitals:   04/06/18 0319 04/06/18 0630  BP: 132/65 (!) 146/75  Pulse: 62 61  Resp: 18 16  Temp:  98.1 F (36.7 C)  SpO2: 98% 96%   Vitals:   04/05/18 1730 04/05/18 1928 04/06/18 0319 04/06/18 0630  BP: (!) 142/88 (!) 145/82 132/65 (!) 146/75  Pulse: 68 64 62 61  Resp: 18 17 18 16   Temp:    98.1 F (36.7 C)  TempSrc:    Oral  SpO2: 97% 98% 98% 96%  Weight:       Height:        General: Pt is alert, awake, not in acute distress Cardiovascular: RRR, S1/S2 +, no rubs, no gallops Respiratory: CTA bilaterally, no wheezing, no rhonchi Abdominal: Soft, NT, ND, bowel sounds + Extremities: no edema, no cyanosis    The results of significant diagnostics from this hospitalization (including imaging, microbiology, ancillary and laboratory) are listed below for reference.     Microbiology: No results found for this or any previous visit (from the past 240 hour(s)).   Labs: BNP (last 3 results) No results for input(s): BNP in the last 8760 hours. Basic Metabolic Panel: Recent Labs  Lab 04/05/18 0644  NA 135  K 3.3*  CL 99  CO2 26  GLUCOSE 148*  BUN 10  CREATININE 0.76  CALCIUM 8.8*   Liver Function Tests: Recent Labs  Lab 04/05/18 0644  AST 20  ALT 20  ALKPHOS 61  BILITOT 1.0  PROT 6.9  ALBUMIN 3.8   Recent Labs  Lab 04/05/18 0644  LIPASE 41   No results for input(s): AMMONIA in the last 168 hours. CBC: Recent Labs  Lab 04/05/18 0644  WBC 7.2  HGB 14.6  HCT 42.3  MCV 100.0  PLT 247   Cardiac Enzymes: Recent Labs  Lab 04/05/18 0644  TROPONINI <0.03   BNP: Invalid input(s): POCBNP CBG: No results for input(s): GLUCAP in the last 168 hours. D-Dimer No results for input(s): DDIMER in the last 72 hours. Hgb A1c No results for input(s): HGBA1C in the last 72 hours. Lipid Profile No results for input(s): CHOL, HDL, LDLCALC, TRIG, CHOLHDL, LDLDIRECT in the last 72 hours. Thyroid function studies No results for input(s): TSH, T4TOTAL, T3FREE, THYROIDAB in the last 72 hours.  Invalid input(s): FREET3 Anemia work up No results for input(s): VITAMINB12, FOLATE, FERRITIN, TIBC, IRON, RETICCTPCT in the last 72 hours. Urinalysis    Component Value Date/Time   COLORURINE YELLOW 04/05/2018 [place] 04/05/2018 0644   LABSPEC 1.011 04/05/2018 0644   PHURINE 6.0 04/05/2018 0644   GLUCOSEU NEGATIVE  04/05/2018 0644   HGBUR NEGATIVE 04/05/2018 0644   HGBUR negative 12/20/2009 0845   BILIRUBINUR NEGATIVE 04/05/2018 0644   BILIRUBINUR n 10/03/2016 1159   [place] 04/05/2018 0644   PROTEINUR NEGATIVE 04/05/2018 0644   UROBILINOGEN negative 10/03/2016 1159   UROBILINOGEN 0.2 09/30/2013 1355   NITRITE NEGATIVE 04/05/2018 0644   LEUKOCYTESUR NEGATIVE 04/05/2018 0644   Sepsis Labs Invalid input(s): PROCALCITONIN,  WBC,  LACTICIDVEN Microbiology No results found for this or any previous visit (from the past 240 hour(s)).   Time coordinating discharge: 42 minutes  SIGNED:   Lady Deutscher, MD  FACP [facility] 04/06/2018, 10:10 AM Pager   If 7PM-7AM, please contact night-coverage [email or web address] Password [email or web address]

## 2018-04-06 NOTE — Evaluation (Signed)
Physical Therapy Evaluation Patient Details Name: Jeffery Franklin MRN: [id number] DOB: 1951-02-10 Today's Date: 04/06/2018   History of Present Illness  67 y.o. male admitted with vertigo, MRI/CT negative, Neurology consulted thought to be vestibular neuritis.  PMH includes: HIV, HTN, HLD.  Pt denies dizziness at time of PT evaluation.     Clinical Impression  Patient evaluated by Physical Therapy with no further acute PT needs identified. All education has been completed and the patient has no further questions. PTA pt independent with all mobility working driving. Upon eval pt presents with R sided vestibular hypofunction, cleared for BPPV or central findings, ambulating unit with mild unsteadiness and no need for assistive device. recommending OP neuro PT for vestibular rehab and balance training.   See below for any follow-up Physical Therapy or equipment needs. PT is signing off. Thank you for this referral.     Follow Up Recommendations Outpatient PT(OP NEURO PT FOR VESTIBULAR )    Equipment Recommendations  None recommended by PT    Recommendations for Other Services       Precautions / Restrictions Precautions Precautions: Fall      Mobility  Bed Mobility Overal bed mobility: Independent                Transfers Overall transfer level: Independent                  Ambulation/Gait Ambulation/Gait assistance: Independent Gait Distance (Feet): 400 Feet Assistive device: None Gait Pattern/deviations: Step-through pattern Gait velocity: decreased   General Gait Details: mild unsteadiness, 18/24 on DGI. no overt LOB or need for AD.   Stairs            Wheelchair Mobility    Modified Rankin (Stroke Patients Only)       Balance                                 Standardized Balance Assessment Standardized Balance Assessment : Dynamic Gait Index   Dynamic Gait Index Level Surface: Normal Change in Gait Speed: Mild  Impairment Gait with Horizontal Head Turns: Mild Impairment Gait with Vertical Head Turns: Mild Impairment Gait and Pivot Turn: Mild Impairment Step Over Obstacle: Mild Impairment Step Around Obstacles: Mild Impairment Steps: Mild Impairment Total Score: 17       Pertinent Vitals/Pain Pain Assessment: No/denies pain    Home Living Family/patient expects to be discharged to:: Private residence Living Arrangements: Spouse/significant other   Type of Home: House Home Access: Level entry     Home Layout: One level        Prior Function Level of Independence: Independent               Hand Dominance        Extremity/Trunk Assessment   Upper Extremity Assessment Upper Extremity Assessment: Overall WFL for tasks assessed    Lower Extremity Assessment Lower Extremity Assessment: Overall WFL for tasks assessed       Communication      Cognition Arousal/Alertness: Awake/alert Behavior During Therapy: WFL for tasks assessed/performed                                          General Comments General comments (skin integrity, edema, etc.): Vestibular assesment performed. discussed findings and relevant anatomy/ pathophysiology with patient.  [employer] and supine roll test negative.  HIT positive to R side L beating nystagmus noted No direction changing nystagmus  Eye cover negative.   Preformed habituation exercises and VORx1 and x2 with patient discussed roll of vestibular rehab OP level at length and answered all questions.     Exercises     Assessment/Plan    PT Assessment All further PT needs can be met in the next venue of care  PT Problem List Decreased balance       PT Treatment Interventions      PT Goals (Current goals can be found in the Care Plan section)  Acute Rehab PT Goals Patient Stated Goal: return home feel better PT Goal Formulation: With patient Potential to Achieve Goals: Good    Frequency      Barriers to discharge        Co-evaluation               AM-PAC PT "6 Clicks" Daily Activity  Outcome Measure Difficulty turning over in bed (including adjusting bedclothes, sheets and blankets)?: None Difficulty moving from lying on back to sitting on the side of the bed? : None Difficulty sitting down on and standing up from a chair with arms (e.g., wheelchair, bedside commode, etc,.)?: None Help needed moving to and from a bed to chair (including a wheelchair)?: None Help needed walking in hospital room?: None Help needed climbing 3-5 steps with a railing? : None 6 Click Score: 24    End of Session Equipment Utilized During Treatment: Gait belt Activity Tolerance: Patient tolerated treatment well Patient left: in bed Nurse Communication: Mobility status PT Visit Diagnosis: Unsteadiness on feet (R26.81)    Time: 5009-3818 PT Time Calculation (min) (ACUTE ONLY): 50 min   Charges:   PT Evaluation $PT Eval Low Complexity: 1 Low PT Treatments $Self Care/Home Management: 8-22        Reinaldo Berber, PT, DPT Acute Rehabilitation Services Pager: [phone] Office: [place] 04/06/2018, 10:15 AM

## 2018-04-08 ENCOUNTER — Encounter: Payer: Self-pay | Admitting: Family Medicine

## 2018-04-08 ENCOUNTER — Ambulatory Visit: Payer: 59 | Admitting: Family Medicine

## 2018-04-08 VITALS — BP 160/70 | HR 75 | Temp 97.9°F | Wt 155.0 lb

## 2018-04-08 DIAGNOSIS — H8123 Vestibular neuronitis, bilateral: Secondary | ICD-10-CM | POA: Diagnosis not present

## 2018-04-08 NOTE — Progress Notes (Signed)
   Subjective:    Patient ID: DEANTHONY MAULL, male    DOB: Feb 06, 1951, 67 y.o.   MRN: [id number]  HPI He was recently seen in the hospital and evaluated for the acute onset of dizziness with nausea and vomiting.  He was admitted and given a rather thorough work-up.  The emergency room record as well as lab and x-rays including MRI was reviewed.  Presently he is doing well on meclizine 25 mg 2 or 3 times per day and he was also given steroid Dosepak.  Presently he has very minimal dizziness but it is not position related.  He also feels slightly fatigued.  He continues on his other medications.  The work-up was essentially negative.  During hospitalization he was also seen by physical therapy and evaluated for BPPV and that test was apparently nondiagnostic.   Review of Systems     Objective:   Physical Exam Alert and oriented x3.  EOMI.  No nystagmus is noted.  He does seem to be steady with his gait.       Assessment & Plan:  Vestibular neuronitis of both ears I will have him back off to 1/2 pill 2-3 times per day to help with the dizziness and reduce sedation.  If his symptoms worsen, I will refer him for Epley maneuvers.  This was all explained to him and he is understanding this.  He will call if further difficulties.

## 2018-04-08 NOTE — Patient Instructions (Signed)
Take one half of the pill 2 or 3 times a day as needed for the dizziness

## 2018-05-13 ENCOUNTER — Other Ambulatory Visit (INDEPENDENT_AMBULATORY_CARE_PROVIDER_SITE_OTHER): Payer: 59

## 2018-05-13 DIAGNOSIS — Z23 Encounter for immunization: Secondary | ICD-10-CM

## 2018-06-07 ENCOUNTER — Telehealth: Payer: Self-pay

## 2018-06-07 DIAGNOSIS — I10 Essential (primary) hypertension: Secondary | ICD-10-CM

## 2018-06-07 NOTE — Telephone Encounter (Signed)
Patient called stating that Micardis is out of stock at the pharmacy. He wants to know if something else can be called into the pharmacy [facility].

## 2018-06-07 NOTE — Telephone Encounter (Signed)
Check with his local drug store and see if they have it then give a one month supply

## 2018-06-10 MED ORDER — TELMISARTAN-HCTZ 40-12.5 MG PO TABS: 1.0000 | ORAL_TABLET | Freq: Every day | ORAL | 0 refills | Status: DC

## 2018-06-10 NOTE — Telephone Encounter (Signed)
[facility] local [facility] and they don't have the medication in stocks. Patient is checking with [facility], if they have it a 30 day supply will be sent per pcp.

## 2018-06-10 NOTE — Telephone Encounter (Signed)
Medication is being sent to [facility] on [address].

## 2018-06-10 NOTE — Telephone Encounter (Signed)
Done

## 2018-07-08 ENCOUNTER — Other Ambulatory Visit: Payer: Self-pay | Admitting: Family Medicine

## 2018-07-08 DIAGNOSIS — I10 Essential (primary) hypertension: Secondary | ICD-10-CM

## 2018-10-07 ENCOUNTER — Encounter: Payer: Self-pay | Admitting: Family Medicine

## 2018-10-07 ENCOUNTER — Ambulatory Visit: Payer: 59 | Admitting: Family Medicine

## 2018-10-07 VITALS — BP 142/90 | HR 70 | Temp 98.6°F | Wt 151.0 lb

## 2018-10-07 DIAGNOSIS — I10 Essential (primary) hypertension: Secondary | ICD-10-CM | POA: Diagnosis not present

## 2018-10-07 DIAGNOSIS — F17200 Nicotine dependence, unspecified, uncomplicated: Secondary | ICD-10-CM | POA: Diagnosis not present

## 2018-10-07 DIAGNOSIS — B20 Human immunodeficiency virus [HIV] disease: Secondary | ICD-10-CM | POA: Diagnosis not present

## 2018-10-07 DIAGNOSIS — Z79899 Other long term (current) drug therapy: Secondary | ICD-10-CM

## 2018-10-07 DIAGNOSIS — Z125 Encounter for screening for malignant neoplasm of prostate: Secondary | ICD-10-CM

## 2018-10-07 MED ORDER — ELVITEG-COBIC-EMTRICIT-TENOFAF 150-150-200-10 MG PO TABS: 1.0000 | ORAL_TABLET | Freq: Every day | ORAL | 3 refills | Status: DC

## 2018-10-07 MED ORDER — VARENICLINE TARTRATE 0.5 MG X 11 & 1 MG X 42 PO MISC: ORAL | 0 refills | Status: DC

## 2018-10-07 MED ORDER — TELMISARTAN-HCTZ 40-12.5 MG PO TABS: 1.0000 | ORAL_TABLET | Freq: Every day | ORAL | 3 refills | Status: DC

## 2018-10-07 NOTE — Progress Notes (Signed)
   Subjective:    Patient ID: Jeffery Franklin, male    DOB: 07-Apr-1951, 68 y.o.   MRN: [id number]  HPI He is here for routine follow-up for his HIV and blood pressure.  He also started smoking again and would like to start back on Chantix.  He has been under a lot of stress as they sold their condo here in town as well as 1 and the [facility] area and then bought a house.  He is now living in an apartment here in town.  He has no fever, chills, cough, congestion, weight change.  His work is going well.  He and his partner are getting along quite nicely. He would also like to have PSA. He has been out of his blood pressure medicine for the last 3 weeks due to shortage of that drug from the pharmaceutical company. Review of Systems     Objective:   Physical Exam Alert and in no distress. Tympanic membranes and canals are normal. Pharyngeal area is normal. Neck is supple without adenopathy or thyromegaly. Cardiac exam shows a regular sinus rhythm without murmurs or gallops. Lungs are clear to auscultation. Abdominal exam shows no hepatosplenomegaly masses or tenderness.  No inguinal or axillary adenopathy.       Assessment & Plan:  Essential hypertension - Plan: telmisartan-hydrochlorothiazide (MICARDIS HCT) 40-12.5 MG tablet  AIDS (acquired immunodeficiency syndrome), CD4 <=200 (HCC) - Plan: elvitegravir-cobicistat-emtricitabine-tenofovir (GENVOYA) 150-150-200-10 MG TABS tablet, CBC with Differential/Platelet, Comprehensive metabolic panel, Lipid panel, T-helper cells (CD4) count (not at [facility]), HIV-1 RNA quant-no reflex-bld  History of long-term treatment with high-risk medication - Plan: CBC with Differential/Platelet, Comprehensive metabolic panel, Lipid panel, T-helper cells (CD4) count (not at [facility]), HIV-1 RNA quant-no reflex-bld  Smoker - Plan: varenicline (CHANTIX STARTING MONTH PAK) 0.5 MG X 11 & 1 MG X 42 tablet  Screening for prostate cancer - Plan: PSA He will return here in 1  month to discuss his success with Chantix.  We will also check his blood pressure and adjust accordingly.  Discussed possible low-dose CT scanning and we will readdress this at a later date.

## 2018-10-08 LAB — HIV-1 RNA QUANT-NO REFLEX-BLD: HIV-1 RNA Viral Load: <20 copies/mL

## 2018-10-08 LAB — COMPREHENSIVE METABOLIC PANEL
A/G RATIO: 1.9 (ref 1.2–2.2)
ALT: 27 IU/L (ref 0–44)
AST: 23 IU/L (ref 0–40)
Albumin: 4.5 g/dL (ref 3.8–4.8)
Alkaline Phosphatase: 71 IU/L (ref 39–117)
BUN/Creatinine Ratio: 15 (ref 10–24)
BUN: 15 mg/dL (ref 8–27)
Bilirubin Total: 0.5 mg/dL (ref 0.0–1.2)
CO2: 26 mmol/L (ref 20–29)
Calcium: 9.5 mg/dL (ref 8.6–10.2)
Chloride: 100 mmol/L (ref 96–106)
Creatinine, Ser: 1.01 mg/dL (ref 0.76–1.27)
GFR calc Af Amer: 89 mL/min/{1.73_m2} (ref >59)
GFR calc non Af Amer: 77 mL/min/{1.73_m2} (ref >59)
Globulin, Total: 2.4 g/dL (ref 1.5–4.5)
Glucose: 93 mg/dL (ref 65–99)
Potassium: 5.1 mmol/L (ref 3.5–5.2)
Sodium: 140 mmol/L (ref 134–144)
Total Protein: 6.9 g/dL (ref 6.0–8.5)

## 2018-10-08 LAB — T-HELPER CELLS (CD4) COUNT (NOT AT ARMC)
% CD 4 Pos. Lymph.: 35.4 % (ref 30.8–58.5)
Absolute CD 4 Helper: 708 /uL (ref 359–1519)
Basophils Absolute: 0 10*3/uL (ref 0.0–0.2)
Basos: 1 %
EOS (ABSOLUTE): 0.2 10*3/uL (ref 0.0–0.4)
EOS: 3 %
Hematocrit: 39.9 % (ref 37.5–51.0)
Hemoglobin: 14.1 g/dL (ref 13.0–17.7)
Immature Grans (Abs): 0 10*3/uL (ref 0.0–0.1)
Immature Granulocytes: 0 %
Lymphocytes Absolute: 2 10*3/uL (ref 0.7–3.1)
Lymphs: 28 %
MCH: 35.4 pg — ABNORMAL HIGH (ref 26.6–33.0)
MCHC: 35.3 g/dL (ref 31.5–35.7)
MCV: 100 fL — ABNORMAL HIGH (ref 79–97)
MONOS ABS: 0.6 10*3/uL (ref 0.1–0.9)
Monocytes: 8 %
Neutrophils Absolute: 4.3 10*3/uL (ref 1.4–7.0)
Neutrophils: 60 %
PLATELETS: 257 10*3/uL (ref 150–450)
RBC: 3.98 x10E6/uL — AB (ref 4.14–5.80)
RDW: 12.3 % (ref 11.6–15.4)
WBC: 7.2 10*3/uL (ref 3.4–10.8)

## 2018-10-08 LAB — LIPID PANEL
Chol/HDL Ratio: 3 ratio (ref 0.0–5.0)
Cholesterol, Total: 181 mg/dL (ref 100–199)
HDL: 61 mg/dL (ref >39)
LDL Calculated: 102 mg/dL — ABNORMAL HIGH (ref 0–99)
Triglycerides: 90 mg/dL (ref 0–149)
VLDL Cholesterol Cal: 18 mg/dL (ref 5–40)

## 2018-10-08 LAB — PSA: Prostate Specific Ag, Serum: 1.2 ng/mL (ref 0.0–4.0)

## 2018-10-16 ENCOUNTER — Telehealth: Payer: Self-pay | Admitting: Family Medicine

## 2018-10-16 DIAGNOSIS — I10 Essential (primary) hypertension: Secondary | ICD-10-CM

## 2018-10-16 MED ORDER — TELMISARTAN-HCTZ 40-12.5 MG PO TABS: 1.0000 | ORAL_TABLET | Freq: Every day | ORAL | 0 refills | Status: DC

## 2018-10-16 NOTE — Telephone Encounter (Signed)
Pt states having problems getting his Telmisartan HCTz from mail order.  I called [facility] and they state it's on back order but will ship as soon as it comes in and will be $125 for 90 days.  Meed 30 days sent to local pharmacy.  [facility].

## 2018-10-19 ENCOUNTER — Telehealth: Payer: Self-pay | Admitting: Family Medicine

## 2018-10-19 NOTE — Telephone Encounter (Signed)
P.A. CHANTIX

## 2018-10-25 NOTE — Telephone Encounter (Signed)
P.A. approved til 10/14/19, pt informed,

## 2018-10-26 ENCOUNTER — Other Ambulatory Visit: Payer: Self-pay | Admitting: Family Medicine

## 2018-10-26 DIAGNOSIS — I10 Essential (primary) hypertension: Secondary | ICD-10-CM

## 2018-10-28 MED ORDER — TELMISARTAN-HCTZ 40-12.5 MG PO TABS: 1.0000 | ORAL_TABLET | Freq: Every day | ORAL | 0 refills | Status: DC

## 2018-12-06 ENCOUNTER — Other Ambulatory Visit: Payer: Self-pay | Admitting: Family Medicine

## 2018-12-06 DIAGNOSIS — I10 Essential (primary) hypertension: Secondary | ICD-10-CM

## 2018-12-28 ENCOUNTER — Other Ambulatory Visit: Payer: Self-pay | Admitting: Family Medicine

## 2018-12-28 DIAGNOSIS — I10 Essential (primary) hypertension: Secondary | ICD-10-CM

## 2019-04-17 ENCOUNTER — Ambulatory Visit: Payer: 59 | Admitting: Family Medicine

## 2019-04-17 ENCOUNTER — Other Ambulatory Visit: Payer: Self-pay

## 2019-04-17 ENCOUNTER — Encounter: Payer: Self-pay | Admitting: Family Medicine

## 2019-04-17 VITALS — BP 120/80 | HR 72 | Temp 96.6°F | Wt 157.0 lb

## 2019-04-17 DIAGNOSIS — Z79899 Other long term (current) drug therapy: Secondary | ICD-10-CM

## 2019-04-17 DIAGNOSIS — B20 Human immunodeficiency virus [HIV] disease: Secondary | ICD-10-CM | POA: Diagnosis not present

## 2019-04-17 DIAGNOSIS — Z23 Encounter for immunization: Secondary | ICD-10-CM

## 2019-04-17 DIAGNOSIS — I10 Essential (primary) hypertension: Secondary | ICD-10-CM | POA: Diagnosis not present

## 2019-04-17 DIAGNOSIS — I700 Atherosclerosis of aorta: Secondary | ICD-10-CM

## 2019-04-17 DIAGNOSIS — Z8739 Personal history of other diseases of the musculoskeletal system and connective tissue: Secondary | ICD-10-CM | POA: Insufficient documentation

## 2019-04-17 MED ORDER — ROSUVASTATIN CALCIUM 10 MG PO TABS: 10.0000 mg | ORAL_TABLET | Freq: Every day | ORAL | 3 refills | Status: DC

## 2019-04-17 MED ORDER — GENVOYA 150-150-200-10 MG PO TABS: 1.0000 | ORAL_TABLET | Freq: Every day | ORAL | 3 refills | Status: DC

## 2019-04-17 MED ORDER — TELMISARTAN-HCTZ 40-12.5 MG PO TABS: 1.0000 | ORAL_TABLET | Freq: Every day | ORAL | 1 refills | Status: DC

## 2019-04-17 NOTE — Progress Notes (Signed)
   Subjective:    Patient ID: Jeffery Franklin, male    DOB: [birth year], 68 y.o.   MRN: [id number]  HPI He is here for an interval evaluation.  He continues on Genvoya and is having no problems with that medication.  He is also taking Micardis and is again doing well on this.  He does have evidence of aortic atherosclerosis.  He does have a previous history of osteoporosis and was treated with Fosamax.  The last follow-up on this was in 2017 which did show some osteopenia.  He did quit smoking.  He does not complain of fever, chills, headache, nausea, vomiting, weight change.  He continues to work.  His home life is going well.  He drinks socially.   Review of Systems     Objective:   Physical Exam Alert and in no distress. Tympanic membranes and canals are normal. Pharyngeal area is normal. Neck is supple without adenopathy or thyromegaly. Cardiac exam shows a regular sinus rhythm without murmurs or gallops. Lungs are clear to auscultation. Abdominal exam shows no masses or tenderness.  No inguinal or axillary adenopathy noted.       Assessment & Plan:  AIDS (acquired immunodeficiency syndrome), CD4 <=200 ([place]) - Plan: CBC with Differential/Platelet, Comprehensive metabolic panel, Lipid panel, HIV-1 RNA quant-no reflex-bld, T-helper cells (CD4) count (not at [facility]), elvitegravir-cobicistat-emtricitabine-tenofovir (GENVOYA) 150-150-200-10 MG TABS tablet  Need for influenza vaccination - Plan: Flu Vaccine QUAD High Dose(Fluad), Flu Vaccine QUAD 6+ mos PF IM (Fluarix Quad PF)  Essential hypertension - Plan: CBC with Differential/Platelet, Comprehensive metabolic panel, telmisartan-hydrochlorothiazide (MICARDIS HCT) 40-12.5 MG tablet  Aortic atherosclerosis ([facility]) - Plan: Lipid panel, rosuvastatin (CRESTOR) 10 MG tablet  History of osteoporosis - Plan: DG Bone Density  History of long-term treatment with high-risk medication I discussed the benefit of being placed on a statin drug and possible  side effects.  We will also follow-up on a DEXA scan.

## 2019-04-18 ENCOUNTER — Telehealth: Payer: Self-pay | Admitting: Family Medicine

## 2019-04-18 ENCOUNTER — Encounter: Payer: Self-pay | Admitting: Family Medicine

## 2019-04-18 DIAGNOSIS — I700 Atherosclerosis of aorta: Secondary | ICD-10-CM

## 2019-04-18 LAB — T-HELPER CELLS (CD4) COUNT (NOT AT ARMC)
% CD 4 Pos. Lymph.: 37.6 % (ref 30.8–58.5)
Absolute CD 4 Helper: 714 /uL (ref 359–1519)
Basophils Absolute: 0 10*3/uL (ref 0.0–0.2)
Basos: 1 %
EOS (ABSOLUTE): 0.4 10*3/uL (ref 0.0–0.4)
Eos: 5 %
Hematocrit: 43.2 % (ref 37.5–51.0)
Hemoglobin: 15.3 g/dL (ref 13.0–17.7)
Immature Grans (Abs): 0 10*3/uL (ref 0.0–0.1)
Immature Granulocytes: 0 %
Lymphocytes Absolute: 1.9 10*3/uL (ref 0.7–3.1)
Lymphs: 27 %
MCH: 34.9 pg — ABNORMAL HIGH (ref 26.6–33.0)
MCHC: 35.4 g/dL (ref 31.5–35.7)
MCV: 98 fL — ABNORMAL HIGH (ref 79–97)
Monocytes Absolute: 0.7 10*3/uL (ref 0.1–0.9)
Monocytes: 9 %
Neutrophils Absolute: 4.1 10*3/uL (ref 1.4–7.0)
Neutrophils: 58 %
Platelets: 258 10*3/uL (ref 150–450)
RBC: 4.39 x10E6/uL (ref 4.14–5.80)
RDW: 12.1 % (ref 11.6–15.4)
WBC: 7.1 10*3/uL (ref 3.4–10.8)

## 2019-04-18 LAB — COMPREHENSIVE METABOLIC PANEL
ALT: 28 IU/L (ref 0–44)
AST: 25 IU/L (ref 0–40)
Albumin/Globulin Ratio: 1.7 (ref 1.2–2.2)
Albumin: 4.5 g/dL (ref 3.8–4.8)
Alkaline Phosphatase: 74 IU/L (ref 39–117)
BUN/Creatinine Ratio: 18 (ref 10–24)
BUN: 16 mg/dL (ref 8–27)
Bilirubin Total: 0.6 mg/dL (ref 0.0–1.2)
CO2: 24 mmol/L (ref 20–29)
Calcium: 9.5 mg/dL (ref 8.6–10.2)
Chloride: 94 mmol/L — ABNORMAL LOW (ref 96–106)
Creatinine, Ser: 0.88 mg/dL (ref 0.76–1.27)
GFR calc Af Amer: 103 mL/min/{1.73_m2} (ref >59)
GFR calc non Af Amer: 90 mL/min/{1.73_m2} (ref >59)
Globulin, Total: 2.7 g/dL (ref 1.5–4.5)
Glucose: 103 mg/dL — ABNORMAL HIGH (ref 65–99)
Potassium: 4.6 mmol/L (ref 3.5–5.2)
Sodium: 132 mmol/L — ABNORMAL LOW (ref 134–144)
Total Protein: 7.2 g/dL (ref 6.0–8.5)

## 2019-04-18 LAB — LIPID PANEL
Chol/HDL Ratio: 3.9 ratio (ref 0.0–5.0)
Cholesterol, Total: 216 mg/dL — ABNORMAL HIGH (ref 100–199)
HDL: 56 mg/dL (ref >39)
LDL Chol Calc (NIH): 119 mg/dL — ABNORMAL HIGH (ref 0–99)
Triglycerides: 233 mg/dL — ABNORMAL HIGH (ref 0–149)
VLDL Cholesterol Cal: 41 mg/dL — ABNORMAL HIGH (ref 5–40)

## 2019-04-18 LAB — HIV-1 RNA QUANT-NO REFLEX-BLD: HIV-1 RNA Viral Load: <20 copies/mL

## 2019-04-18 MED ORDER — ROSUVASTATIN CALCIUM 20 MG PO TABS: ORAL_TABLET | ORAL | 3 refills | Status: DC

## 2019-04-18 NOTE — Telephone Encounter (Signed)
Spoke with pt and he does want to go with the 20mg  1/2 tab per day as cheaper option.  Called change into pharmacy

## 2019-04-18 NOTE — Telephone Encounter (Signed)
Called [facility] and generic Crestor does not need P.A. it is covered but cost is $50 for 30 days and $125 for 90 days only thru mail order.  They did suggest cheaper option is 20mg  1/2 tab qd and cost would be the same $50 for 30/ $125 for 90 which would actually be 60/180 days worth for pt.  Called and left message for pt to see if he wants to switch

## 2019-05-13 ENCOUNTER — Encounter: Payer: Self-pay | Admitting: Family Medicine

## 2019-05-26 ENCOUNTER — Other Ambulatory Visit: Payer: Self-pay

## 2019-05-26 ENCOUNTER — Telehealth: Payer: Self-pay

## 2019-05-26 ENCOUNTER — Ambulatory Visit: Payer: 59 | Admitting: Family Medicine

## 2019-05-26 ENCOUNTER — Encounter: Payer: Self-pay | Admitting: Family Medicine

## 2019-05-26 VITALS — BP 138/78 | HR 80 | Temp 98.2°F | Wt 159.6 lb

## 2019-05-26 DIAGNOSIS — M79662 Pain in left lower leg: Secondary | ICD-10-CM

## 2019-05-26 NOTE — Progress Notes (Signed)
   Subjective:    Patient ID: Jeffery Franklin, male    DOB: May 06, 1951, 68 y.o.   MRN: [id number]  HPI He complains of a 25-month history of left calf pain with physical activity.  No history of injury or overuse.  The pain usually goes away very quickly after he stops his activity.  There are no numbness, tingling or weakness.  No trouble with the right leg.   Review of Systems     Objective:   Physical Exam Alert and in no distress.  Skin and hair pattern is normal.  Good capillary refill.  Dorsalis pedis pulse was difficult to feel and could not also feel the posterior tibial.  Femoral pulses bilaterally was normal.  Right lower extremity shows good dorsalis pedis pulse.       Assessment & Plan:

## 2019-05-26 NOTE — Telephone Encounter (Signed)
Called pt to advise his doppler was scheduled or the same day as his dexa scan. It will be held at 2:30pm at the [place] office. [place]

## 2019-05-26 NOTE — Addendum Note (Signed)
Addended by: Elyse Jarvis on: 05/26/2019 03:46 PM   Modules accepted: Orders

## 2019-07-07 ENCOUNTER — Other Ambulatory Visit: Payer: 59

## 2019-07-14 ENCOUNTER — Ambulatory Visit
Admission: RE | Admit: 2019-07-14 | Discharge: 2019-07-14 | Disposition: A | Discharge status: Home / Self Care | Payer: 59 | Source: Ambulatory Visit | Attending: Family Medicine | Admitting: Family Medicine

## 2019-07-14 ENCOUNTER — Other Ambulatory Visit: Payer: Self-pay

## 2019-07-14 DIAGNOSIS — M79662 Pain in left lower leg: Secondary | ICD-10-CM

## 2019-07-14 DIAGNOSIS — Z8739 Personal history of other diseases of the musculoskeletal system and connective tissue: Secondary | ICD-10-CM

## 2019-07-16 ENCOUNTER — Telehealth: Payer: Self-pay

## 2019-07-16 DIAGNOSIS — R6889 Other general symptoms and signs: Secondary | ICD-10-CM

## 2019-07-16 NOTE — Telephone Encounter (Signed)
-----   Message from Denita Lung, MD sent at 07/15/2019  8:10 AM EST ----- Set him up to see Donnella Bi.

## 2019-07-16 NOTE — Progress Notes (Signed)
Referral has been placed. 

## 2019-07-21 ENCOUNTER — Telehealth: Payer: Self-pay | Admitting: *Deleted

## 2019-07-23 ENCOUNTER — Encounter: Payer: Self-pay | Admitting: Family Medicine

## 2019-08-05 NOTE — Telephone Encounter (Signed)
Created in error

## 2019-08-19 ENCOUNTER — Other Ambulatory Visit: Payer: Self-pay

## 2019-08-19 ENCOUNTER — Encounter: Payer: Self-pay | Admitting: Cardiovascular Disease

## 2019-08-19 ENCOUNTER — Ambulatory Visit: Payer: 59 | Admitting: Cardiovascular Disease

## 2019-08-19 VITALS — BP 140/82 | HR 62 | Temp 98.0°F | Ht 70.5 in | Wt 160.0 lb

## 2019-08-19 DIAGNOSIS — E782 Mixed hyperlipidemia: Secondary | ICD-10-CM

## 2019-08-19 DIAGNOSIS — I10 Essential (primary) hypertension: Secondary | ICD-10-CM

## 2019-08-19 DIAGNOSIS — I739 Peripheral vascular disease, unspecified: Secondary | ICD-10-CM

## 2019-08-19 DIAGNOSIS — Z87891 Personal history of nicotine dependence: Secondary | ICD-10-CM

## 2019-08-19 DIAGNOSIS — E785 Hyperlipidemia, unspecified: Secondary | ICD-10-CM | POA: Insufficient documentation

## 2019-08-19 NOTE — Patient Instructions (Signed)
Medication Instructions:  Your physician recommends that you continue on your current medications as directed. Please refer to the Current Medication list given to you today.  If you need a refill on your cardiac medications before your next appointment, please call your pharmacy.   Lab work: NONE  Testing/Procedures: Your physician has requested that you have a lower extremity arterial exercise duplex. During this test, exercise and ultrasound are used to evaluate arterial blood flow in the legs. Allow one hour for this exam. There are no restrictions or special instructions.  AND  Your physician has requested that you have an ankle brachial index (ABI). During this test an ultrasound and blood pressure cuff are used to evaluate the arteries that supply the arms and legs with blood. Allow thirty minutes for this exam. There are no restrictions or special instructions.  Follow-Up: At [facility], you and your health needs are our priority.  As part of our continuing mission to provide you with exceptional heart care, we have created designated Provider Care Teams.  These Care Teams include your primary Cardiologist (physician) and Advanced Practice Providers (APPs -  Physician Assistants and Nurse Practitioners) who all work together to provide you with the care you need, when you need it. You may see Dr. Gwenlyn Found  or one of the following Advanced Practice Providers on your designated Care Team:    Kerin Ransom, PA-C  [place], [place]  Coletta Memos, [place]  Your physician wants you to follow-up in: 3 months with Dr. Gwenlyn Found

## 2019-08-19 NOTE — Progress Notes (Signed)
e

## 2019-08-19 NOTE — Assessment & Plan Note (Signed)
History of essential potential blood pressure measured today 140/82.  He is on Micardis and hydrochlorothiazide.

## 2019-08-19 NOTE — Assessment & Plan Note (Signed)
History of lifestyle limiting left calf claudication over the last 6 months with ABIs performed 07/14/2019 revealing a normal resting and exercise right ABI with a left ABI that was 0.87 at rest following to 0.51 with exercise.  Based on this regard to get lower extremity arterial Doppler studies I discussed angiography potential endovascular therapy.

## 2019-08-19 NOTE — Assessment & Plan Note (Signed)
History of discontinued tobacco use having smoked 20 to 40 pack years and quit 1 year ago.

## 2019-08-19 NOTE — Progress Notes (Signed)
08/19/2019 Aaron Mose Janus   Sep 20, 1950  [id number]  Primary Physician Denita Lung, MD Primary Cardiologist: Lorretta Harp MD Lupe Carney, [place]  HPI:  Jeffery Franklin is a 69 y.o. thin and fit appearing married Caucasian male with no children referred by Dr. Redmond School for peripheral vascular valuation because of left thigh limiting claudication.  He is HIV positive on medications and has had no detectable virus.  He works as a Technical sales engineer.  His risk factors include 20 to 40 pack years of tobacco abuse having quit 1 year ago, treated hypertension and hyperlipidemia.  Does have a family history of heart disease with a mother who had stents and is since deceased brother has had a myocardial infarction and stents as well.  Unfortunately his older sister age 54 died a week ago of a stroke.  He is never had a heart attack or stroke.  Denies chest pain or shortness of breath.  He does get left calf claudication of the last 6 months which is fairly reproducible.  He is fairly active and walks and rides bikes.  Segmental pressures performed 07/14/2019 revealed normal right ABI with a left ABI that fell from 0.87 at rest to 0.51 with exercise suggesting obstructive disease.   Current Meds  Medication Sig  . elvitegravir-cobicistat-emtricitabine-tenofovir (GENVOYA) 150-150-200-10 MG TABS tablet Take 1 tablet by mouth daily with breakfast.  . rosuvastatin (CRESTOR) 20 MG tablet Take 1/2 tablet daily  . telmisartan-hydrochlorothiazide (MICARDIS HCT) 40-12.5 MG tablet Take 1 tablet by mouth daily.     No Known Allergies  Social History   Socioeconomic History  . Marital status: Married    Spouse name: Not on file  . Number of children: 0  . Years of education: Not on file  . Highest education level: Not on file  Occupational History  . Occupation: Psychologist, counselling: [employer]  Tobacco Use  . Smoking status: Former Smoker    Packs/day: 0.50      Years: 20.00    Pack years: 10.00    Types: Cigarettes    Quit date: 03/31/2016    Years since quitting: 3.3  . Smokeless tobacco: Never Used  Substance and Sexual Activity  . Alcohol use: Yes    Alcohol/week: 14.0 standard drinks    Types: 14 Glasses of wine per week    Comment: 1-2 glasses of red wine daily  . Drug use: No  . Sexual activity: Yes  Other Topics Concern  . Not on file  Social History Narrative  . Not on file   Social Determinants of Health   Financial Resource Strain:   . Difficulty of Paying Living Expenses: Not on file  Food Insecurity:   . Worried About Charity fundraiser in the Last Year: Not on file  . Ran Out of Food in the Last Year: Not on file  Transportation Needs:   . Lack of Transportation (Medical): Not on file  . Lack of Transportation (Non-Medical): Not on file  Physical Activity:   . Days of Exercise per Week: Not on file  . Minutes of Exercise per Session: Not on file  Stress:   . Feeling of Stress : Not on file  Social Connections:   . Frequency of Communication with Friends and Family: Not on file  . Frequency of Social Gatherings with Friends and Family: Not on file  . Attends Religious Services: Not on file  . Active Member of Clubs  or Organizations: Not on file  . Attends Archivist Meetings: Not on file  . Marital Status: Not on file  Intimate Partner Violence:   . Fear of Current or Ex-Partner: Not on file  . Emotionally Abused: Not on file  . Physically Abused: Not on file  . Sexually Abused: Not on file     Review of Systems: General: negative for chills, fever, night sweats or weight changes.  Cardiovascular: negative for chest pain, dyspnea on exertion, edema, orthopnea, palpitations, paroxysmal nocturnal dyspnea or shortness of breath Dermatological: negative for rash Respiratory: negative for cough or wheezing Urologic: negative for hematuria Abdominal: negative for nausea, vomiting, diarrhea, bright red  blood per rectum, melena, or hematemesis Neurologic: negative for visual changes, syncope, or dizziness All other systems reviewed and are otherwise negative except as noted above.    Blood pressure 140/82, pulse 62, temperature 98 F (36.7 C), height 5' 10.5" (1.791 m), weight 160 lb (72.6 kg).  General appearance: alert and no distress Neck: no adenopathy, no carotid bruit, no JVD, supple, symmetrical, trachea midline and thyroid not enlarged, symmetric, no tenderness/mass/nodules Lungs: clear to auscultation bilaterally Heart: regular rate and rhythm, S1, S2 normal, no murmur, click, rub or gallop Extremities: Normal Pulses: Diminished left pedal pulse Skin: Skin color, texture, turgor normal. No rashes or lesions Neurologic: Alert and oriented X 3, normal strength and tone. Normal symmetric reflexes. Normal coordination and gait  EKG sinus rhythm at 62 with incomplete right bundle branch block.  I personally reviewed this EKG.  ASSESSMENT AND PLAN:   HTN (hypertension) History of essential potential blood pressure measured today 140/82.  He is on Micardis and hydrochlorothiazide.  Former smoker History of discontinued tobacco use having smoked 20 to 40 pack years and quit 1 year ago.  Hyperlipidemia History of hyperlipidemia on statin therapy with lipid profile performed 04/17/2019 revealing total cholesterol of 216, LDL of 102.  He is followed by his PCP.  Peripheral arterial disease ([place]) History of lifestyle limiting left calf claudication over the last 6 months with ABIs performed 07/14/2019 revealing a normal resting and exercise right ABI with a left ABI that was 0.87 at rest following to 0.51 with exercise.  Based on this regard to get lower extremity arterial Doppler studies I discussed angiography potential endovascular therapy.      Lorretta Harp MD FACP,FACC,FAHA, [facility] 08/19/2019 3:13 PM

## 2019-08-19 NOTE — Assessment & Plan Note (Signed)
History of hyperlipidemia on statin therapy with lipid profile performed 04/17/2019 revealing total cholesterol of 216, LDL of 102.  He is followed by his PCP.

## 2019-09-22 ENCOUNTER — Ambulatory Visit (HOSPITAL_COMMUNITY)
Admission: RE | Admit: 2019-09-22 | Payer: 59 | Source: Ambulatory Visit | Attending: Cardiovascular Disease | Admitting: Cardiovascular Disease

## 2019-09-25 ENCOUNTER — Other Ambulatory Visit: Payer: Self-pay

## 2019-09-25 ENCOUNTER — Encounter (HOSPITAL_COMMUNITY): Payer: 59

## 2019-09-25 ENCOUNTER — Encounter: Payer: Self-pay | Admitting: Family Medicine

## 2019-09-25 ENCOUNTER — Ambulatory Visit: Payer: 59 | Admitting: Family Medicine

## 2019-09-25 VITALS — BP 146/88 | HR 69 | Temp 98.4°F | Wt 161.0 lb

## 2019-09-25 DIAGNOSIS — I700 Atherosclerosis of aorta: Secondary | ICD-10-CM

## 2019-09-25 DIAGNOSIS — I10 Essential (primary) hypertension: Secondary | ICD-10-CM | POA: Diagnosis not present

## 2019-09-25 DIAGNOSIS — Z8739 Personal history of other diseases of the musculoskeletal system and connective tissue: Secondary | ICD-10-CM | POA: Diagnosis not present

## 2019-09-25 DIAGNOSIS — Z79899 Other long term (current) drug therapy: Secondary | ICD-10-CM | POA: Diagnosis not present

## 2019-09-25 DIAGNOSIS — Z87891 Personal history of nicotine dependence: Secondary | ICD-10-CM

## 2019-09-25 DIAGNOSIS — B20 Human immunodeficiency virus [HIV] disease: Secondary | ICD-10-CM | POA: Diagnosis not present

## 2019-09-25 DIAGNOSIS — I739 Peripheral vascular disease, unspecified: Secondary | ICD-10-CM

## 2019-09-25 NOTE — Progress Notes (Signed)
   Subjective:    Patient ID: Jeffery Franklin, male    DOB: 1950-10-23, 69 y.o.   MRN: [id number]  HPI He is here for an interval evaluation.  He continues on his HIV medications as well as his blood pressure medications and having no difficulty with them.  He is also taking Crestor with no myalgias.  He is followed by Dr. Alvester Chou for his claudication.  He is now living in [place] and plans to switch down there for his medical care and also follow-up on the claudication.  He has no other concerns or complaints.  He does have a previous history of osteoporosis and recent DEXA shows a T score of -1.7 so no osteopenic.  He also has evidence of atherosclerosis.   Review of Systems     Objective:   Physical Exam Alert and in no distress. Tympanic membranes and canals are normal. Pharyngeal area is normal. Neck is supple without adenopathy or thyromegaly. Cardiac exam shows a regular sinus rhythm without murmurs or gallops. Lungs are clear to auscultation.        Assessment & Plan:  AIDS (acquired immunodeficiency syndrome), CD4 <=200 ([place]) - Plan: HIV-1 RNA quant-no reflex-bld, T-helper cells (CD4) count (not at [facility]  Essential hypertension - Plan: CBC with Differential/Platelet, Comprehensive metabolic panel  History of long-term treatment with high-risk medication  History of osteoporosis  Former smoker  Claudication of left lower extremity ([place]) - Plan: CBC with Differential/Platelet, Comprehensive metabolic panel, Lipid panel  Aortic atherosclerosis ([place]) - Plan: CBC with Differential/Platelet, Comprehensive metabolic panel, Lipid panel His medications will run out in September.  He plans to get a cardiologist as well as a PCP in [place].

## 2019-09-26 LAB — T-HELPER CELLS (CD4) COUNT (NOT AT ARMC)
% CD 4 Pos. Lymph.: 37.2 % (ref 30.8–58.5)
Absolute CD 4 Helper: 707 /uL (ref 359–1519)
Basophils Absolute: 0 10*3/uL (ref 0.0–0.2)
Basos: 1 %
EOS (ABSOLUTE): 0.3 10*3/uL (ref 0.0–0.4)
Eos: 3 %
Hematocrit: 38.9 % (ref 37.5–51.0)
Hemoglobin: 14.1 g/dL (ref 13.0–17.7)
Immature Grans (Abs): 0 10*3/uL (ref 0.0–0.1)
Immature Granulocytes: 0 %
Lymphocytes Absolute: 1.9 10*3/uL (ref 0.7–3.1)
Lymphs: 22 %
MCH: 34.6 pg — ABNORMAL HIGH (ref 26.6–33.0)
MCHC: 36.2 g/dL — ABNORMAL HIGH (ref 31.5–35.7)
MCV: 96 fL (ref 79–97)
Monocytes Absolute: 0.8 10*3/uL (ref 0.1–0.9)
Monocytes: 9 %
Neutrophils Absolute: 5.5 10*3/uL (ref 1.4–7.0)
Neutrophils: 65 %
Platelets: 282 10*3/uL (ref 150–450)
RBC: 4.07 x10E6/uL — ABNORMAL LOW (ref 4.14–5.80)
RDW: 12 % (ref 11.6–15.4)
WBC: 8.4 10*3/uL (ref 3.4–10.8)

## 2019-09-26 LAB — LIPID PANEL
Chol/HDL Ratio: 2.2 ratio (ref 0.0–5.0)
Cholesterol, Total: 132 mg/dL (ref 100–199)
HDL: 59 mg/dL (ref >39)
LDL Chol Calc (NIH): 57 mg/dL (ref 0–99)
Triglycerides: 83 mg/dL (ref 0–149)
VLDL Cholesterol Cal: 16 mg/dL (ref 5–40)

## 2019-09-26 LAB — COMPREHENSIVE METABOLIC PANEL
ALT: 34 IU/L (ref 0–44)
AST: 26 IU/L (ref 0–40)
Albumin/Globulin Ratio: 2.3 — ABNORMAL HIGH (ref 1.2–2.2)
Albumin: 4.6 g/dL (ref 3.8–4.8)
Alkaline Phosphatase: 73 IU/L (ref 39–117)
BUN/Creatinine Ratio: 12 (ref 10–24)
BUN: 11 mg/dL (ref 8–27)
Bilirubin Total: 0.8 mg/dL (ref 0.0–1.2)
CO2: 24 mmol/L (ref 20–29)
Calcium: 9.2 mg/dL (ref 8.6–10.2)
Chloride: 91 mmol/L — ABNORMAL LOW (ref 96–106)
Creatinine, Ser: 0.89 mg/dL (ref 0.76–1.27)
GFR calc Af Amer: 102 mL/min/{1.73_m2} (ref >59)
GFR calc non Af Amer: 88 mL/min/{1.73_m2} (ref >59)
Globulin, Total: 2 g/dL (ref 1.5–4.5)
Glucose: 94 mg/dL (ref 65–99)
Potassium: 4.1 mmol/L (ref 3.5–5.2)
Sodium: 129 mmol/L — ABNORMAL LOW (ref 134–144)
Total Protein: 6.6 g/dL (ref 6.0–8.5)

## 2019-09-26 LAB — HIV-1 RNA QUANT-NO REFLEX-BLD
HIV-1 RNA Viral Load Log: 1.477 log10copy/mL
HIV-1 RNA Viral Load: 30 copies/mL

## 2019-09-27 ENCOUNTER — Encounter: Payer: Self-pay | Admitting: Family Medicine

## 2019-10-22 ENCOUNTER — Encounter (HOSPITAL_COMMUNITY): Payer: 59

## 2019-11-16 ENCOUNTER — Other Ambulatory Visit: Payer: Self-pay | Admitting: Family Medicine

## 2019-11-16 DIAGNOSIS — I10 Essential (primary) hypertension: Secondary | ICD-10-CM

## 2019-11-18 ENCOUNTER — Ambulatory Visit: Payer: 59 | Admitting: Cardiovascular Disease

## 2019-12-09 ENCOUNTER — Other Ambulatory Visit: Payer: Self-pay

## 2020-01-15 ENCOUNTER — Telehealth: Payer: Self-pay

## 2020-01-15 NOTE — Telephone Encounter (Signed)
I called the pt. Insurance company per the pt. His Genvoya prescription needs a PA done, the insurance company told me that the medication did not need a PA and to call the pharmacy and let them know it did not need one. I called the pharmacy and they said they did not have his new insurance coverage in the system, so I provided them with his new insurance coverage. They ran the prescription and said it would be 1,100 dollars and his old insurance covered the Genvoya 100%. The pharmacists said she would call the pt. And let him know.

## 2020-05-06 ENCOUNTER — Other Ambulatory Visit: Payer: Self-pay | Admitting: Family Medicine

## 2020-05-06 DIAGNOSIS — B20 Human immunodeficiency virus [HIV] disease: Secondary | ICD-10-CM

## 2020-05-06 NOTE — Telephone Encounter (Signed)
[facility] is requesting to fill pt genvoya. Please advise [facility]

## 2020-05-19 ENCOUNTER — Other Ambulatory Visit: Payer: Self-pay | Admitting: Family Medicine

## 2020-10-12 ENCOUNTER — Other Ambulatory Visit: Payer: Self-pay | Admitting: Family Medicine

## 2020-10-12 DIAGNOSIS — I10 Essential (primary) hypertension: Secondary | ICD-10-CM

## 2020-10-14 ENCOUNTER — Other Ambulatory Visit: Payer: Self-pay | Admitting: Family Medicine

## 2020-10-14 DIAGNOSIS — I10 Essential (primary) hypertension: Secondary | ICD-10-CM

## 2020-10-30 ENCOUNTER — Other Ambulatory Visit: Payer: Self-pay | Admitting: Family Medicine

## 2020-10-30 DIAGNOSIS — I10 Essential (primary) hypertension: Secondary | ICD-10-CM

## 2023-06-06 ENCOUNTER — Encounter: Payer: Self-pay | Admitting: Gastroenterology
